# Patient Record
Sex: Female | Born: 1965 | Race: White | Hispanic: No | Marital: Married | Smoking: Never smoker
Health system: Southern US, Community
[De-identification: ages and names within clinical notes are randomized; demographics above are authoritative.]

## PROBLEM LIST (undated history)

## (undated) DIAGNOSIS — I214 Non-ST elevation (NSTEMI) myocardial infarction: Secondary | ICD-10-CM

## (undated) DIAGNOSIS — E785 Hyperlipidemia, unspecified: Secondary | ICD-10-CM

## (undated) DIAGNOSIS — F988 Other specified behavioral and emotional disorders with onset usually occurring in childhood and adolescence: Secondary | ICD-10-CM

## (undated) DIAGNOSIS — G43009 Migraine without aura, not intractable, without status migrainosus: Secondary | ICD-10-CM

## (undated) DIAGNOSIS — I2510 Atherosclerotic heart disease of native coronary artery without angina pectoris: Secondary | ICD-10-CM

## (undated) DIAGNOSIS — G4700 Insomnia, unspecified: Secondary | ICD-10-CM

## (undated) DIAGNOSIS — N189 Chronic kidney disease, unspecified: Secondary | ICD-10-CM

## (undated) DIAGNOSIS — I2542 Coronary artery dissection: Secondary | ICD-10-CM

## (undated) HISTORY — PX: LAPAROSCOPY: SHX197

## (undated) HISTORY — PX: COLONOSCOPY: SHX174

## (undated) HISTORY — DX: Other specified behavioral and emotional disorders with onset usually occurring in childhood and adolescence: F98.8

## (undated) HISTORY — DX: Atherosclerotic heart disease of native coronary artery without angina pectoris: I25.10

## (undated) HISTORY — DX: Hyperlipidemia, unspecified: E78.5

## (undated) HISTORY — DX: Insomnia, unspecified: G47.00

## (undated) HISTORY — DX: Non-ST elevation (NSTEMI) myocardial infarction: I21.4

## (undated) HISTORY — DX: Coronary artery dissection: I25.42

## (undated) HISTORY — DX: Chronic kidney disease, unspecified: N18.9

## (undated) HISTORY — DX: Migraine without aura, not intractable, without status migrainosus: G43.009

---

## 1997-07-20 ENCOUNTER — Inpatient Hospital Stay (HOSPITAL_COMMUNITY): Admission: AD | Admit: 1997-07-20 | Discharge: 1997-07-20 | Payer: Self-pay | Admitting: Obstetrics and Gynecology

## 1997-07-27 ENCOUNTER — Inpatient Hospital Stay (HOSPITAL_COMMUNITY): Admission: AD | Admit: 1997-07-27 | Discharge: 1997-07-27 | Payer: Self-pay | Admitting: Obstetrics and Gynecology

## 1997-08-23 ENCOUNTER — Other Ambulatory Visit: Admission: RE | Admit: 1997-08-23 | Discharge: 1997-08-23 | Payer: Self-pay | Admitting: Obstetrics and Gynecology

## 1997-11-05 ENCOUNTER — Inpatient Hospital Stay (HOSPITAL_COMMUNITY): Admission: AD | Admit: 1997-11-05 | Discharge: 1997-11-05 | Payer: Self-pay | Admitting: Obstetrics and Gynecology

## 1997-11-12 ENCOUNTER — Inpatient Hospital Stay (HOSPITAL_COMMUNITY): Admission: RE | Admit: 1997-11-12 | Discharge: 1997-11-12 | Payer: Self-pay | Admitting: Obstetrics and Gynecology

## 1997-11-28 ENCOUNTER — Inpatient Hospital Stay (HOSPITAL_COMMUNITY): Admission: AD | Admit: 1997-11-28 | Discharge: 1997-11-28 | Payer: Self-pay | Admitting: Obstetrics and Gynecology

## 1997-12-03 ENCOUNTER — Inpatient Hospital Stay (HOSPITAL_COMMUNITY): Admission: AD | Admit: 1997-12-03 | Discharge: 1997-12-03 | Payer: Self-pay | Admitting: Obstetrics and Gynecology

## 1997-12-26 ENCOUNTER — Inpatient Hospital Stay (HOSPITAL_COMMUNITY): Admission: AD | Admit: 1997-12-26 | Discharge: 1997-12-26 | Payer: Self-pay | Admitting: Obstetrics and Gynecology

## 1997-12-31 ENCOUNTER — Inpatient Hospital Stay (HOSPITAL_COMMUNITY): Admission: AD | Admit: 1997-12-31 | Discharge: 1997-12-31 | Payer: Self-pay | Admitting: Obstetrics and Gynecology

## 1998-01-07 ENCOUNTER — Encounter (HOSPITAL_COMMUNITY): Admission: RE | Admit: 1998-01-07 | Discharge: 1998-04-07 | Payer: Self-pay | Admitting: Obstetrics and Gynecology

## 1998-08-05 ENCOUNTER — Encounter: Admission: RE | Admit: 1998-08-05 | Discharge: 1998-11-03 | Payer: Self-pay | Admitting: Obstetrics and Gynecology

## 1998-10-20 ENCOUNTER — Inpatient Hospital Stay (HOSPITAL_COMMUNITY): Admission: AD | Admit: 1998-10-20 | Discharge: 1998-10-22 | Payer: Self-pay | Admitting: Gynecology

## 1998-10-25 ENCOUNTER — Encounter (HOSPITAL_COMMUNITY): Admission: RE | Admit: 1998-10-25 | Discharge: 1999-01-23 | Payer: Self-pay | Admitting: Gynecology

## 1998-12-06 ENCOUNTER — Other Ambulatory Visit: Admission: RE | Admit: 1998-12-06 | Discharge: 1998-12-06 | Payer: Self-pay | Admitting: Obstetrics and Gynecology

## 1999-01-25 ENCOUNTER — Encounter (HOSPITAL_COMMUNITY): Admission: RE | Admit: 1999-01-25 | Discharge: 1999-04-25 | Payer: Self-pay | Admitting: Obstetrics and Gynecology

## 1999-11-28 ENCOUNTER — Other Ambulatory Visit: Admission: RE | Admit: 1999-11-28 | Discharge: 1999-11-28 | Payer: Self-pay | Admitting: Obstetrics and Gynecology

## 2001-02-12 ENCOUNTER — Other Ambulatory Visit: Admission: RE | Admit: 2001-02-12 | Discharge: 2001-02-12 | Payer: Self-pay | Admitting: Obstetrics and Gynecology

## 2003-07-26 ENCOUNTER — Other Ambulatory Visit: Admission: RE | Admit: 2003-07-26 | Discharge: 2003-07-26 | Payer: Self-pay | Admitting: Gynecology

## 2004-09-07 ENCOUNTER — Encounter: Admission: RE | Admit: 2004-09-07 | Discharge: 2004-09-07 | Payer: Self-pay | Admitting: Gynecology

## 2004-09-21 ENCOUNTER — Other Ambulatory Visit: Admission: RE | Admit: 2004-09-21 | Discharge: 2004-09-21 | Payer: Self-pay | Admitting: Gynecology

## 2005-09-27 ENCOUNTER — Encounter: Admission: RE | Admit: 2005-09-27 | Discharge: 2005-09-27 | Payer: Self-pay | Admitting: Gynecology

## 2006-12-04 ENCOUNTER — Encounter: Admission: RE | Admit: 2006-12-04 | Discharge: 2006-12-04 | Payer: Self-pay | Admitting: Gynecology

## 2006-12-16 ENCOUNTER — Other Ambulatory Visit: Admission: RE | Admit: 2006-12-16 | Discharge: 2006-12-16 | Payer: Self-pay | Admitting: Gynecology

## 2007-07-27 ENCOUNTER — Emergency Department (HOSPITAL_COMMUNITY): Admission: EM | Admit: 2007-07-27 | Discharge: 2007-07-28 | Payer: Self-pay | Admitting: Emergency Medicine

## 2008-01-21 ENCOUNTER — Other Ambulatory Visit: Admission: RE | Admit: 2008-01-21 | Discharge: 2008-01-21 | Payer: Self-pay | Admitting: Obstetrics and Gynecology

## 2008-02-18 ENCOUNTER — Encounter: Admission: RE | Admit: 2008-02-18 | Discharge: 2008-02-18 | Payer: Self-pay | Admitting: Obstetrics and Gynecology

## 2009-03-17 ENCOUNTER — Encounter: Admission: RE | Admit: 2009-03-17 | Discharge: 2009-03-17 | Payer: Self-pay | Admitting: Obstetrics and Gynecology

## 2010-03-20 ENCOUNTER — Encounter: Admission: RE | Admit: 2010-03-20 | Discharge: 2010-03-20 | Payer: Self-pay | Admitting: Obstetrics and Gynecology

## 2010-06-05 ENCOUNTER — Encounter: Payer: Self-pay | Admitting: Obstetrics and Gynecology

## 2011-02-05 LAB — BASIC METABOLIC PANEL
CO2: 28
Chloride: 104
Creatinine, Ser: 1.08
GFR calc Af Amer: >60
Potassium: 4
Sodium: 138

## 2011-02-05 LAB — URINALYSIS, ROUTINE W REFLEX MICROSCOPIC
Bilirubin Urine: NEGATIVE
Glucose, UA: NEGATIVE
Specific Gravity, Urine: 1.03
pH: 6

## 2011-02-05 LAB — DIFFERENTIAL
Eosinophils Absolute: 0
Lymphs Abs: 1
Monocytes Absolute: 0.5
Monocytes Relative: 4
Neutrophils Relative %: 89 — ABNORMAL HIGH

## 2011-02-05 LAB — URINE MICROSCOPIC-ADD ON

## 2011-02-05 LAB — CBC
HCT: 38
Hemoglobin: 13
MCV: 87.7
RBC: 4.34
WBC: 13.8 — ABNORMAL HIGH

## 2011-04-03 ENCOUNTER — Other Ambulatory Visit: Payer: Self-pay | Admitting: Obstetrics and Gynecology

## 2011-04-03 DIAGNOSIS — Z1231 Encounter for screening mammogram for malignant neoplasm of breast: Secondary | ICD-10-CM

## 2011-04-26 ENCOUNTER — Ambulatory Visit
Admission: RE | Admit: 2011-04-26 | Discharge: 2011-04-26 | Disposition: A | Discharge status: Home / Self Care | Payer: Managed Care, Other (non HMO) | Source: Ambulatory Visit | Attending: Obstetrics and Gynecology | Admitting: Obstetrics and Gynecology

## 2011-04-26 DIAGNOSIS — Z1231 Encounter for screening mammogram for malignant neoplasm of breast: Secondary | ICD-10-CM

## 2012-04-18 ENCOUNTER — Other Ambulatory Visit: Payer: Self-pay | Admitting: Obstetrics and Gynecology

## 2012-04-18 DIAGNOSIS — Z1231 Encounter for screening mammogram for malignant neoplasm of breast: Secondary | ICD-10-CM

## 2012-05-26 ENCOUNTER — Ambulatory Visit
Admission: RE | Admit: 2012-05-26 | Discharge: 2012-05-26 | Disposition: A | Discharge status: Home / Self Care | Payer: Managed Care, Other (non HMO) | Source: Ambulatory Visit | Attending: Obstetrics and Gynecology | Admitting: Obstetrics and Gynecology

## 2012-05-26 DIAGNOSIS — Z1231 Encounter for screening mammogram for malignant neoplasm of breast: Secondary | ICD-10-CM

## 2012-05-29 ENCOUNTER — Other Ambulatory Visit: Payer: Self-pay | Admitting: Obstetrics and Gynecology

## 2012-05-29 DIAGNOSIS — R928 Other abnormal and inconclusive findings on diagnostic imaging of breast: Secondary | ICD-10-CM

## 2012-06-10 ENCOUNTER — Ambulatory Visit
Admission: RE | Admit: 2012-06-10 | Discharge: 2012-06-10 | Disposition: A | Discharge status: Home / Self Care | Payer: Managed Care, Other (non HMO) | Source: Ambulatory Visit | Attending: Obstetrics and Gynecology | Admitting: Obstetrics and Gynecology

## 2012-06-10 DIAGNOSIS — R928 Other abnormal and inconclusive findings on diagnostic imaging of breast: Secondary | ICD-10-CM

## 2013-01-27 ENCOUNTER — Telehealth: Payer: Self-pay | Admitting: *Deleted

## 2013-01-27 ENCOUNTER — Other Ambulatory Visit: Payer: Self-pay | Admitting: Obstetrics and Gynecology

## 2013-01-27 NOTE — Telephone Encounter (Signed)
LM on VM that confirms "Reached the [facility]" that request for Marcia completed.  RX Clonazepam 0.5 mg #30 (thirty),  Take one tablet by mouth at bedtime as needed for insomnia with five refills called [facility] per instructed by Dr Hyacinth Meeker.

## 2013-01-27 NOTE — Telephone Encounter (Signed)
Patient was given Rx # 30/5 RF at AEX 07/01/12. Please advise if ok to refill until next AEX.

## 2013-01-29 MED ORDER — CLONAZEPAM 0.5 MG PO TABS: ORAL_TABLET | ORAL | Status: DC

## 2013-01-29 NOTE — Addendum Note (Signed)
Addended by: Elisha Headland on: 01/29/2013 09:35 AM   Modules accepted: Orders

## 2013-06-30 ENCOUNTER — Encounter: Payer: Self-pay | Admitting: Obstetrics and Gynecology

## 2013-07-03 ENCOUNTER — Encounter: Payer: Self-pay | Admitting: Gynecology

## 2013-07-03 ENCOUNTER — Ambulatory Visit: Payer: Self-pay | Admitting: Obstetrics and Gynecology

## 2013-07-03 ENCOUNTER — Ambulatory Visit: Payer: Self-pay | Admitting: Gynecology

## 2013-07-24 ENCOUNTER — Encounter: Payer: Self-pay | Admitting: Gynecology

## 2013-07-24 ENCOUNTER — Ambulatory Visit (INDEPENDENT_AMBULATORY_CARE_PROVIDER_SITE_OTHER): Payer: Managed Care, Other (non HMO) | Admitting: Gynecology

## 2013-07-24 VITALS — BP 95/68 | HR 75 | Resp 12 | Ht 66.5 in | Wt 138.0 lb

## 2013-07-24 DIAGNOSIS — Z01419 Encounter for gynecological examination (general) (routine) without abnormal findings: Secondary | ICD-10-CM

## 2013-07-24 DIAGNOSIS — G4700 Insomnia, unspecified: Secondary | ICD-10-CM

## 2013-07-24 DIAGNOSIS — Z0000 Encounter for general adult medical examination without abnormal findings: Secondary | ICD-10-CM

## 2013-07-24 DIAGNOSIS — G43009 Migraine without aura, not intractable, without status migrainosus: Secondary | ICD-10-CM

## 2013-07-24 DIAGNOSIS — Z8632 Personal history of gestational diabetes: Secondary | ICD-10-CM

## 2013-07-24 DIAGNOSIS — Z124 Encounter for screening for malignant neoplasm of cervix: Secondary | ICD-10-CM

## 2013-07-24 LAB — POCT URINALYSIS DIPSTICK
PH UA: 5
UROBILINOGEN UA: NEGATIVE

## 2013-07-24 LAB — HEMOGLOBIN, FINGERSTICK: Hemoglobin, fingerstick: 14.1 g/dL (ref 12.0–16.0)

## 2013-07-24 MED ORDER — CLONAZEPAM 0.5 MG PO TABS: ORAL_TABLET | ORAL | Status: DC

## 2013-07-24 MED ORDER — ALMOTRIPTAN MALATE 6.25 MG PO TABS: 6.2500 mg | ORAL_TABLET | ORAL | Status: DC | PRN

## 2013-07-24 NOTE — Patient Instructions (Signed)

## 2013-07-24 NOTE — Progress Notes (Signed)
48 y.o. Married Caucasian female   [phone] [id number] here for annual exam. Pt is currently sexually active.  Pt has had a few missed cycles and then bled 34m in 12/14, variable flow light and heavy.  January-March cycles have been normal, light and 5d long, minimal cramping.  No dyspareunia or post-coital bleeding.    Patient's last menstrual period was 07/12/2013.          Sexually active: yes  The current method of family planning is Husband has Vasectomy.    Exercising: yes  Tennis, Group Exercise 3-5x/wk Last pap: 05/16/11 Neg Alcohol: less than 1  Tobacco: no BSE: yes  Mammogram: 05/29/12 Bi-Rads 1 ; scheduled   Hgb: 14.1 ; Urine: Leuks 2    Health Maintenance  Topic Date Due  . Tetanus/tdap  09/29/1982  . Influenza Vaccine  12/12/2012  . Pap Smear  05/15/2014    Family History  Problem Relation Age of Onset  . Hypertension Mother   . Ovarian cancer Mother   . Ovarian cancer Maternal Aunt   . Ovarian cancer Maternal Grandfather     There are no active problems to display for this patient.   No past medical history on file.  Past Surgical History  Procedure Laterality Date  . Laparoscopy  1995     [facility]    Allergies: Review of patient's allergies indicates no known allergies.  Current Outpatient Prescriptions  Medication Sig Dispense Refill  . clonazePAM (KLONOPIN) 0.5 MG tablet TAKE 1 TABLET BY MOUTH AT BEDTIME AS NEEDED FOR INSOMNIA  30 tablet  5  . IBUPROFEN PO Take by mouth as needed.      . Multiple Vitamins-Minerals (MULTIVITAMIN PO) Take by mouth.       No current facility-administered medications for this visit.    ROS: Pertinent items are noted in HPI.  Exam:    BP 95/68  Pulse 75  Resp 12  Ht 5' 6.5" (1.689 m)  Wt 138 lb (62.596 kg)  BMI 21.94 kg/m2  LMP 07/12/2013 Weight change: @WEIGHTCHANGE @ Last 3 height recordings:  Ht Readings from Last 3 Encounters:  07/24/13 5' 6.5" (1.689 m)   General appearance: alert, cooperative and appears stated  age Head: Normocephalic, without obvious abnormality, atraumatic Neck: no adenopathy, no carotid bruit, no JVD, supple, symmetrical, trachea midline and thyroid not enlarged, symmetric, no tenderness/mass/nodules Lungs: clear to auscultation bilaterally Breasts: normal appearance, no masses or tenderness Heart: regular rate and rhythm, S1, S2 normal, no murmur, click, rub or gallop Abdomen: soft, non-tender; bowel sounds normal; no masses,  no organomegaly Extremities: extremities normal, atraumatic, no cyanosis or edema Skin: Skin color, texture, turgor normal. No rashes or lesions Lymph nodes: Cervical, supraclavicular, and axillary nodes normal. no inguinal nodes palpated Neurologic: Grossly normal   Pelvic: External genitalia:  no lesions              Urethra: normal appearing urethra with no masses, tenderness or lesions              Bartholins and Skenes: Bartholin's, Urethra, Skene's normal                 Vagina: normal appearing vagina with normal color and discharge, no lesions              Cervix: normal appearance              Pap taken: yes        Bimanual Exam:  Uterus:  uterus is normal size, shape,  consistency and nontender                                      Adnexa:    normal adnexa in size, nontender and no masses                                      Rectovaginal: Confirms                                      Anus:  normal sphincter tone, no lesions  A: well woman Perimenopausal History of A2DM     P: mammogram pap smear with HRHPV,  New guidelines reviewed Discussed risks of DM with A2DM, suggest 2h glucose challenge q5y, pt will do next year and will do fasting labs counseled on breast self exam, mammography screening, menopause, adequate intake of calcium and vitamin D, diet and exercise return annually or prn   An After Visit Summary was printed and given to the patient.

## 2013-07-28 LAB — IPS PAP TEST WITH HPV

## 2013-08-03 ENCOUNTER — Other Ambulatory Visit: Payer: Self-pay

## 2013-08-03 DIAGNOSIS — Z1231 Encounter for screening mammogram for malignant neoplasm of breast: Secondary | ICD-10-CM

## 2013-08-24 ENCOUNTER — Ambulatory Visit
Admission: RE | Admit: 2013-08-24 | Discharge: 2013-08-24 | Disposition: A | Discharge status: Home / Self Care | Payer: Managed Care, Other (non HMO) | Source: Ambulatory Visit

## 2013-08-24 DIAGNOSIS — Z1231 Encounter for screening mammogram for malignant neoplasm of breast: Secondary | ICD-10-CM

## 2013-10-16 ENCOUNTER — Other Ambulatory Visit: Payer: Self-pay | Admitting: *Deleted

## 2013-10-16 DIAGNOSIS — G43009 Migraine without aura, not intractable, without status migrainosus: Secondary | ICD-10-CM

## 2013-10-16 NOTE — Telephone Encounter (Addendum)
[facility]ax [facility] Axert 6.25 mg Last Refilled: aex 07/24/13 #10/1 Refill  Aex Scheduled: 07/26/14  Please advise refill

## 2013-10-19 MED ORDER — ALMOTRIPTAN MALATE 6.25 MG PO TABS: 6.2500 mg | ORAL_TABLET | ORAL | Status: DC | PRN

## 2013-10-19 NOTE — Telephone Encounter (Signed)
Okay to refill Axert 6.25 mg #10/1 refill per Dr. Farrel Gobble, rx sent in to [facility].

## 2014-02-10 ENCOUNTER — Other Ambulatory Visit: Payer: Self-pay

## 2014-02-10 DIAGNOSIS — G4700 Insomnia, unspecified: Secondary | ICD-10-CM

## 2014-02-10 NOTE — Telephone Encounter (Signed)
Last AEX: 07/24/13 Last refill:07/24/13 #30 X 5 Current AEX:07/26/14  Please advise

## 2014-02-11 NOTE — Telephone Encounter (Signed)
Pt is supposed to be going out of town tomorrow. And needs the rx

## 2014-02-12 ENCOUNTER — Other Ambulatory Visit: Payer: Self-pay

## 2014-02-12 DIAGNOSIS — G43001 Migraine without aura, not intractable, with status migrainosus: Secondary | ICD-10-CM

## 2014-02-12 MED ORDER — CLONAZEPAM 0.5 MG PO TABS: ORAL_TABLET | ORAL | Status: DC

## 2014-02-12 NOTE — Telephone Encounter (Signed)
Last AEX: 07/24/13  Last refill:10/19/13 #10 X 1 Current AEX:07/26/14

## 2014-02-12 NOTE — Telephone Encounter (Signed)
Okay to refill #30/5 refills per Dr. Farrel GobbleLathrop.

## 2014-02-12 NOTE — Telephone Encounter (Signed)
RX printed, signed and faxed; patient is aware.

## 2014-02-15 NOTE — Telephone Encounter (Signed)
LMTCB

## 2014-02-15 NOTE — Telephone Encounter (Signed)
Has the patient used 20 tablets in 7647m? If she has had a change in the intensity or frequency of her headaches, she may need to see her PCP to manage, please confirm

## 2014-02-16 NOTE — Telephone Encounter (Signed)
Pt returning call

## 2014-02-16 NOTE — Telephone Encounter (Signed)
Patient says she has had more frequency of headaches and she has used 20 pills in 4 months.  She says Dr. Tresa Resomine used to refill the rx throughout the year.  Please advise, patient says she's fine either way

## 2014-02-16 NOTE — Telephone Encounter (Signed)
LMTCB

## 2014-02-19 MED ORDER — ALMOTRIPTAN MALATE 6.25 MG PO TABS: 6.2500 mg | ORAL_TABLET | ORAL | Status: DC | PRN

## 2014-02-19 NOTE — Telephone Encounter (Signed)
PT SHOULD BE SEEN BY PCP DUE TO CHANGE IN FREQUENCY OF MIGRAINES

## 2014-02-19 NOTE — Telephone Encounter (Signed)
Patient says she will see a primary care physicians and f/u with them.

## 2014-03-15 ENCOUNTER — Encounter: Payer: Self-pay | Admitting: Gynecology

## 2014-04-12 ENCOUNTER — Telehealth: Payer: Self-pay

## 2014-04-12 NOTE — Telephone Encounter (Signed)
Spoke with pt and reschedule AEX with Ms. Clayborne DanaPatti

## 2014-07-21 ENCOUNTER — Other Ambulatory Visit: Payer: Self-pay

## 2014-07-21 DIAGNOSIS — Z1231 Encounter for screening mammogram for malignant neoplasm of breast: Secondary | ICD-10-CM

## 2014-07-26 ENCOUNTER — Ambulatory Visit: Payer: Self-pay | Admitting: Nurse Practitioner

## 2014-07-26 ENCOUNTER — Ambulatory Visit: Payer: Managed Care, Other (non HMO) | Admitting: Gynecology

## 2014-08-12 ENCOUNTER — Ambulatory Visit: Payer: Self-pay | Admitting: Nurse Practitioner

## 2014-08-19 ENCOUNTER — Ambulatory Visit (INDEPENDENT_AMBULATORY_CARE_PROVIDER_SITE_OTHER): Payer: Managed Care, Other (non HMO) | Admitting: Certified Nurse Midwife

## 2014-08-19 ENCOUNTER — Telehealth: Payer: Self-pay | Admitting: Nurse Practitioner

## 2014-08-19 ENCOUNTER — Encounter: Payer: Self-pay | Admitting: Certified Nurse Midwife

## 2014-08-19 VITALS — BP 130/76 | HR 86 | Ht 66.5 in | Wt 142.8 lb

## 2014-08-19 DIAGNOSIS — N924 Excessive bleeding in the premenopausal period: Secondary | ICD-10-CM | POA: Diagnosis not present

## 2014-08-19 NOTE — Telephone Encounter (Signed)
Pt says she has had a period for 6-7 weeks.

## 2014-08-19 NOTE — Patient Instructions (Signed)
Menorrhagia °Menorrhagia is the medical term for when your menstrual periods are heavy or last longer than usual. With menorrhagia, every period you have may cause enough blood loss and cramping that you are unable to maintain your usual activities. °CAUSES  °In some cases, the cause of heavy periods is unknown, but a number of conditions may cause menorrhagia. Common causes include: °· A problem with the hormone-producing thyroid gland (hypothyroid). °· Noncancerous growths in the uterus (polyps or fibroids). °· An imbalance of the estrogen and progesterone hormones. °· One of your ovaries not releasing an egg during one or more months. °· Side effects of having an intrauterine device (IUD). °· Side effects of some medicines, such as anti-inflammatory medicines or blood thinners. °· A bleeding disorder that stops your blood from clotting normally. °SIGNS AND SYMPTOMS  °During a normal period, bleeding lasts between 4 and 8 days. Signs that your periods are too heavy include: °· You routinely have to change your pad or tampon every 1 or 2 hours because it is completely soaked. °· You pass blood clots larger than 1 inch (2.5 cm) in size. °· You have bleeding for more than 7 days. °· You need to use pads and tampons at the same time because of heavy bleeding. °· You need to wake up to change your pads or tampons during the night. °· You have symptoms of anemia, such as tiredness, fatigue, or shortness of breath.  °DIAGNOSIS  °Your health care provider will perform a physical exam and ask you questions about your symptoms and menstrual history. Other tests may be ordered based on what the health care provider finds during the exam. These tests can include: °· Blood tests. Blood tests are used to check if you are pregnant or have hormonal changes, a bleeding or thyroid disorder, low iron levels (anemia), or other problems. °· Endometrial biopsy. Your health care provider takes a sample of tissue from the inside of your  uterus to be examined under a microscope. °· Pelvic ultrasound. This test uses sound waves to make a picture of your uterus, ovaries, and vagina. The pictures can show if you have fibroids or other growths. °· Hysteroscopy. For this test, your health care provider will use a small telescope to look inside your uterus. °Based on the results of your initial tests, your health care provider may recommend further testing. °TREATMENT  °Treatment may not be needed. If it is needed, your health care provider may recommend treatment with one or more medicines first. If these do not reduce bleeding enough, a surgical treatment might be an option. The best treatment for you will depend on:  °· Whether you need to prevent pregnancy.   °· Your desire to have children in the future. °· The cause and severity of your bleeding. °· Your opinion and personal preference.   °Medicines for menorrhagia may include: °· Birth control methods that use hormones. These include the pill, skin patch, vaginal ring, shots that you get every 3 months, hormonal IUD, and implant. These treatments reduce bleeding during your menstrual period. °· Medicines that thicken blood and slow bleeding. °· Medicines that reduce swelling, such as ibuprofen.  °· Medicines that contain a synthetic hormone called progestin.   °· Medicines that make the ovaries stop working for a short time.   °You may need surgical treatment for menorrhagia if the medicines are unsuccessful. Treatment options include: °· Dilation and curettage (D&C). In this procedure, your health care provider opens (dilates) your cervix and then scrapes or suctions tissue from   the lining of your uterus to reduce menstrual bleeding. °· Operative hysteroscopy. This procedure uses a tiny tube with a light (hysteroscope) to view your uterine cavity and can help in the surgical removal of a polyp that may be causing heavy periods. °· Endometrial ablation. Through various techniques, your health care  provider permanently destroys the entire lining of your uterus (endometrium). After endometrial ablation, most women have little or no menstrual flow. Endometrial ablation reduces your ability to become pregnant. °· Endometrial resection. This surgical procedure uses an electrosurgical wire loop to remove the lining of the uterus. This procedure also reduces your ability to become pregnant. °· Hysterectomy. Surgical removal of the uterus and cervix is a permanent procedure that stops menstrual periods. Pregnancy is not possible after a hysterectomy. This procedure requires anesthesia and hospitalization. °HOME CARE INSTRUCTIONS  °· Only take over-the-counter or prescription medicines as directed by your health care provider. Take prescribed medicines exactly as directed. Do not change or switch medicines without consulting your health care provider. °· Take any prescribed iron pills exactly as directed by your health care provider. Long-term heavy bleeding may result in low iron levels. Iron pills help replace the iron your body lost from heavy bleeding. Iron may cause constipation. If this becomes a problem, increase the bran, fruits, and roughage in your diet. °· Do not take aspirin or medicines that contain aspirin 1 week before or during your menstrual period. Aspirin may make the bleeding worse. °· If you need to change your sanitary pad or tampon more than once every 2 hours, stay in bed and rest as much as possible until the bleeding stops. °· Eat well-balanced meals. Eat foods high in iron. Examples are leafy green vegetables, meat, liver, eggs, and whole grain breads and cereals. Do not try to lose weight until the abnormal bleeding has stopped and your blood iron level is back to normal. °SEEK MEDICAL CARE IF:  °· You soak through a pad or tampon every 1 or 2 hours, and this happens every time you have a period. °· You need to use pads and tampons at the same time because you are bleeding so much. °· You  need to change your pad or tampon during the night. °· You have a period that lasts for more than 8 days. °· You pass clots bigger than 1 inch wide. °· You have irregular periods that happen more or less often than once a month. °· You feel dizzy or faint. °· You feel very weak or tired. °· You feel short of breath or feel your heart is beating too fast when you exercise. °· You have nausea and vomiting or diarrhea while you are taking your medicine. °· You have any problems that may be related to the medicine you are taking. °SEEK IMMEDIATE MEDICAL CARE IF:  °· You soak through 4 or more pads or tampons in 2 hours. °· You have any bleeding while you are pregnant. °MAKE SURE YOU:  °· Understand these instructions. °· Will watch your condition. °· Will get help right away if you are not doing well or get worse. °Document Released: 04/30/2005 Document Revised: 05/05/2013 Document Reviewed: 10/19/2012 °Exit[employer]® Patient Information ©2015 [employer]. This information is not intended to replace advice given to you by your health care provider. Make sure you discuss any questions you have with your health care provider. °Perimenopause °Perimenopause is the time when your body begins to move into the menopause (no menstrual period for 12 straight months).   It is a natural process. Perimenopause can begin 2-8 years before the menopause and usually lasts for 1 year after the menopause. During this time, your ovaries may or may not produce an egg. The ovaries vary in their production of estrogen and progesterone hormones each month. This can cause irregular menstrual periods, difficulty getting pregnant, vaginal bleeding between periods, and uncomfortable symptoms. °CAUSES °· Irregular production of the ovarian hormones, estrogen and progesterone, and not ovulating every month. °· Other causes include: °¨ Tumor of the pituitary gland in the brain. °¨ Medical disease that affects the ovaries. °¨ Radiation  treatment. °¨ Chemotherapy. °¨ Unknown causes. °¨ Heavy smoking and excessive alcohol intake can bring on perimenopause sooner. °SIGNS AND SYMPTOMS  °· Hot flashes. °· Night sweats. °· Irregular menstrual periods. °· Decreased sex drive. °· Vaginal dryness. °· Headaches. °· Mood swings. °· Depression. °· Memory problems. °· Irritability. °· Tiredness. °· Weight gain. °· Trouble getting pregnant. °· The beginning of losing bone cells (osteoporosis). °· The beginning of hardening of the arteries (atherosclerosis). °DIAGNOSIS  °Your health care provider will make a diagnosis by analyzing your age, menstrual history, and symptoms. He or she will do a physical exam and note any changes in your body, especially your female organs. Female hormone tests may or may not be helpful depending on the amount of female hormones you produce and when you produce them. However, other hormone tests may be helpful to rule out other problems. °TREATMENT  °In some cases, no treatment is needed. The decision on whether treatment is necessary during the perimenopause should be made by you and your health care provider based on how the symptoms are affecting you and your lifestyle. Various treatments are available, such as: °· Treating individual symptoms with a specific medicine for that symptom. °· Herbal medicines that can help specific symptoms. °· Counseling. °· Group therapy. °HOME CARE INSTRUCTIONS  °· Keep track of your menstrual periods (when they occur, how heavy they are, how long between periods, and how long they last) as well as your symptoms and when they started. °· Only take over-the-counter or prescription medicines as directed by your health care provider. °· Sleep and rest. °· Exercise. °· Eat a diet that contains calcium (good for your bones) and soy (acts like the estrogen hormone). °· Do not smoke. °· Avoid alcoholic beverages. °· Take vitamin supplements as recommended by your health care provider. Taking vitamin E  may help in certain cases. °· Take calcium and vitamin D supplements to help prevent bone loss. °· Group therapy is sometimes helpful. °· Acupuncture may help in some cases. °SEEK MEDICAL CARE IF:  °· You have questions about any symptoms you are having. °· You need a referral to a specialist (gynecologist, psychiatrist, or psychologist). °SEEK IMMEDIATE MEDICAL CARE IF:  °· You have vaginal bleeding. °· Your period lasts longer than 8 days. °· Your periods are recurring sooner than 21 days. °· You have bleeding after intercourse. °· You have severe depression. °· You have pain when you urinate. °· You have severe headaches. °· You have vision problems. °Document Released: 06/07/2004 Document Revised: 02/18/2013 Document Reviewed: 11/27/2012 °ExitCare® Patient Information ©2015 [employer]. This information is not intended to replace advice given to you by your health care provider. Make sure you discuss any questions you have with your health care provider. ° ° °

## 2014-08-19 NOTE — Progress Notes (Signed)
48 y.o.Married white female [id number] here with complaint of bleeding with menses for the past 6 weeks. Patient had not had a period in 6 months prior to this onset on 06/28/14. Period was very heavy at onset and then proceeded to decrease after 10 days to light bleeding for several days and restarted like another period in 3/16. Had heavy bleeding for the past 3-4 days and now seems to be decreasing again like the end of period. No fatigue or weakness. Denies pelvic pain or other health issues. Patient has had some hot flashes and occasional night sweats. "Just thought I was in menopause and didn't worry about no periods". Last aex 3/15 with Dr. Farrel GobbleLathrop. Will schedule for aex today.  Slight change weight only.   O:Healthy female WDWN  POCT  Hgb.14.7 Affect: normal, orientation x 3  Exam:Skin warm and dry Thyroid normal no enlargement Abdomen:non tender, soft Lymph node: no enlargement or tenderness Pelvic exam: External genital: normal female BUS: negative Vagina: normal discharge noted scant blood present.   Cervix: normal, non tender, no CMT, no blood noted from cervix Uterus: normal, non tender Adnexa:normal, non tender, no masses or fullness noted  A:Normal pelvic exam History of prolonged amenorrhea with long cycle consistent with perimenopausal changes  P:Discussed findings of normal pelvic exam. Discussed perimenopausal changes etiology and bleeding expectations. Questions addressed. Given menses calendar to record and with normal/abnormal parameters. Discussed Advil 600 mg with food every 6 hours for next 24-48 hours to assist in decreasing bleeding. Warnings of excessive bleeding given. Discussed labs to evaluate amenorrhea, which can also be thyroid or pituitary changes also. Agreeable. Lab: TSH,FSH,Prolactin  Advise of bleeding status if continues. Schedule aex in next 2 weeks to monitor status. Patient agreeable.  Rv prn, as above

## 2014-08-19 NOTE — Telephone Encounter (Signed)
Spoke with patient. Patient states that she her last cycle was in the summer of 2015 which lasted for one month. Started this cycle 6 weeks ago. "I know I am perimenopausal but I thought this would have stopped by now. It is bright red and clotty and heavy. I am having to wear a super tampon and change it every 3-4 hours. I have never had to wear a super tampon before." Denies any fatigue, weakness, or light headedness. Advised patient will need to be seen in office for further evaluation. Patient is agreeable. Appointment scheduled for today at 2:45pm with Verner Choleborah S. Leonard CNM. Patient is agreeable to date and time.  Routing to provider for final review. Patient agreeable to disposition. Will close encounter

## 2014-08-20 LAB — FOLLICLE STIMULATING HORMONE: FSH: 38.4 m[IU]/mL

## 2014-08-20 LAB — TSH: TSH: 2.294 u[IU]/mL (ref 0.350–4.500)

## 2014-08-20 LAB — PROLACTIN: Prolactin: 3.2 ng/mL

## 2014-08-24 ENCOUNTER — Telehealth: Payer: Self-pay | Admitting: Certified Nurse Midwife

## 2014-08-24 NOTE — Telephone Encounter (Signed)
Patient calling to give Verner Choleborah S. Leonard CNM an update that her cycle has stopped. Routing to Verner Choleborah S. Leonard CNM as Lorain ChildesFYI.  Notes Recorded by Eliezer Bottomavina J Johnson, CMA on 08/20/2014 at 11:41 AM Patient notified of results as written by provider. Pt states she bleeding has slowed up & she took advil today. Pt will call us on Monday if she is still bleeding. Notes Recorded by Verner Choleborah S Leonard, CNM on 08/20/2014 at 8:19 AM Notify patient that Prolactin and TSH are normal FSH is perimenopausal range, so explains cycle changes. Advise if bleeding does not subside as discussed and follow instructions about notification if no period Patient status with Advil use on bleeding?

## 2014-08-24 NOTE — Telephone Encounter (Signed)
Patient is calling to give an update her cycle has stopped.

## 2014-08-29 NOTE — Progress Notes (Signed)
Consider PUS as well.  Reviewed personally.  Lum KeasM. Suzanne Pakou Rainbow, MD.

## 2014-08-30 ENCOUNTER — Ambulatory Visit
Admission: RE | Admit: 2014-08-30 | Discharge: 2014-08-30 | Disposition: A | Discharge status: Home / Self Care | Payer: 59 | Source: Ambulatory Visit

## 2014-08-30 DIAGNOSIS — Z1231 Encounter for screening mammogram for malignant neoplasm of breast: Secondary | ICD-10-CM

## 2014-09-06 ENCOUNTER — Ambulatory Visit (INDEPENDENT_AMBULATORY_CARE_PROVIDER_SITE_OTHER): Payer: Managed Care, Other (non HMO) | Admitting: Certified Nurse Midwife

## 2014-09-06 ENCOUNTER — Encounter: Payer: Self-pay | Admitting: Certified Nurse Midwife

## 2014-09-06 VITALS — BP 110/70 | HR 74 | Resp 16 | Ht 66.25 in | Wt 142.0 lb

## 2014-09-06 DIAGNOSIS — Z01419 Encounter for gynecological examination (general) (routine) without abnormal findings: Secondary | ICD-10-CM

## 2014-09-06 DIAGNOSIS — N951 Menopausal and female climacteric states: Secondary | ICD-10-CM

## 2014-09-06 NOTE — Progress Notes (Signed)
49 y.o. [id number] Married  Caucasian Fe here for annual exam. Periods were normal until last period on 08/01/14 which lasted until 4/10//16, which she was seen here for. FSH showed perimenopausal range. Denies any hot flashes, occasional night sweat.  Sees PCP for aex/labs, prn. Has colonoscopy scheduled. Has right wrist sprain from tennis, plans to see orthopedic. No other health issues today.  Patient's last menstrual period was 08/01/2014.          Sexually active: Yes.    The current method of family planning is vasectomy.    Exercising: Yes.    tennis,exercise class Smoker:  no  Health Maintenance: Pap: 07-24-13 neg HPV HR neg MMG: 08-30-14 category c density,birads 1:neg Colonoscopy:  Scheduled for 10-01-14  BMD:   none TDaP:  3/16 Labs: none Self breast exam: done occ   reports that she has never smoked. She has never used smokeless tobacco. She reports that she drinks alcohol. She reports that she does not use illicit drugs.  Past Medical History  Diagnosis Date  . Insomnia     klonapin  . Migraine without aura     History reviewed. No pertinent past surgical history.  Current Outpatient Prescriptions  Medication Sig Dispense Refill  . almotriptan (AXERT) 6.25 MG tablet Take 1 tablet (6.25 mg total) by mouth as needed. 10 tablet 0  . clonazePAM (KLONOPIN) 0.5 MG tablet TAKE 1 TABLET BY MOUTH AT BEDTIME AS NEEDED FOR INSOMNIA 30 tablet 5  . IBUPROFEN PO Take by mouth as needed.    . Multiple Vitamins-Minerals (MULTIVITAMIN PO) Take by mouth.     No current facility-administered medications for this visit.    Family History  Problem Relation Age of Onset  . Hypertension Mother   . Ovarian cancer Maternal Aunt     ROS:  Pertinent items are noted in HPI.  Otherwise, a comprehensive ROS was negative.  Exam:   BP 110/70 mmHg  Pulse 74  Resp 16  Ht 5' 6.25" (1.683 m)  Wt 142 lb (64.411 kg)  BMI 22.74 kg/m2  LMP 08/01/2014 Height: 5' 6.25" (168.3 cm) Ht Readings from  Last 3 Encounters:  09/06/14 5' 6.25" (1.683 m)  08/19/14 5' 6.5" (1.689 m)  07/24/13 5' 6.5" (1.689 m)    General appearance: alert, cooperative and appears stated age Head: Normocephalic, without obvious abnormality, atraumatic Neck: no adenopathy, supple, symmetrical, trachea midline and thyroid normal to inspection and palpation Lungs: clear to auscultation bilaterally Breasts: normal appearance, no masses or tenderness, No nipple retraction or dimpling, No nipple discharge or bleeding, No axillary or supraclavicular adenopathy Heart: regular rate and rhythm Abdomen: soft, non-tender; no masses,  no organomegaly Extremities: extremities normal, atraumatic, no cyanosis or edema Skin: Skin color, texture, turgor normal. No rashes or lesions Lymph nodes: Cervical, supraclavicular, and axillary nodes normal. No abnormal inguinal nodes palpated Neurologic: Grossly normal   Pelvic: External genitalia:  no lesions              Urethra:  normal appearing urethra with no masses, tenderness or lesions              Bartholin's and Skene's: normal                 Vagina: normal appearing vagina with normal color and discharge, no lesions              Cervix: normal, non tender, no lesions              Pap  taken: No. Bimanual Exam:  Uterus:  normal size, contour, position, consistency, mobility, non-tender              Adnexa: not evaluated               Rectovaginal: Confirms               Anus:  normal sphincter tone, no lesions  Chaperone present: Yes  A:  Well Woman with normal exam  Perimenopausal with cycle changes  Right wrist sprain with plans to see orthopedic  P:   Reviewed health and wellness pertinent to exam  Keeping menses calendar and will advise if no menses in 3 months.  Follow up with MD as indicated  Pap smear not taken today   counseled on breast self exam, mammography screening, adequate intake of calcium and vitamin D, diet and exercise  return annually or  prn  An After Visit Summary was printed and given to the patient.

## 2014-09-06 NOTE — Patient Instructions (Signed)

## 2014-09-07 NOTE — Progress Notes (Signed)
Reviewed personally.  M. Suzanne Declynn Lopresti, MD.  

## 2015-06-13 ENCOUNTER — Telehealth: Payer: Self-pay | Admitting: Certified Nurse Midwife

## 2015-06-13 ENCOUNTER — Encounter: Payer: Self-pay | Admitting: Obstetrics and Gynecology

## 2015-06-13 ENCOUNTER — Ambulatory Visit (INDEPENDENT_AMBULATORY_CARE_PROVIDER_SITE_OTHER): Payer: Managed Care, Other (non HMO) | Admitting: Obstetrics and Gynecology

## 2015-06-13 VITALS — BP 100/60 | HR 64 | Resp 14 | Wt 147.0 lb

## 2015-06-13 DIAGNOSIS — N939 Abnormal uterine and vaginal bleeding, unspecified: Secondary | ICD-10-CM | POA: Diagnosis not present

## 2015-06-13 DIAGNOSIS — N951 Menopausal and female climacteric states: Secondary | ICD-10-CM

## 2015-06-13 LAB — CBC
HEMATOCRIT: 40.2 % (ref 36.0–46.0)
HEMOGLOBIN: 13.9 g/dL (ref 12.0–15.0)
MCH: 29.7 pg (ref 26.0–34.0)
MCHC: 34.6 g/dL (ref 30.0–36.0)
MCV: 85.9 fL (ref 78.0–100.0)
MPV: 11 fL (ref 8.6–12.4)
Platelets: 239 10*3/uL (ref 150–400)
RBC: 4.68 MIL/uL (ref 3.87–5.11)
RDW: 14 % (ref 11.5–15.5)
WBC: 9.6 10*3/uL (ref 4.0–10.5)

## 2015-06-13 LAB — TSH: TSH: 1.118 u[IU]/mL (ref 0.350–4.500)

## 2015-06-13 LAB — FERRITIN: Ferritin: 57 ng/mL (ref 10–291)

## 2015-06-13 MED ORDER — MEDROXYPROGESTERONE ACETATE 10 MG PO TABS: ORAL_TABLET | ORAL | Status: DC

## 2015-06-13 MED ORDER — MEDROXYPROGESTERONE ACETATE 5 MG PO TABS: ORAL_TABLET | ORAL | Status: DC

## 2015-06-13 NOTE — Progress Notes (Addendum)
Patient ID: Anne Landry, female   DOB: Nov 29, 1965, 50 y.o.   MRN: [id number] GYNECOLOGY  VISIT   HPI: 50 y.o.   Married  Caucasian  female   [phone] with Patient's last menstrual period was 05/17/2015.   here c/o vaginal bleeding x 4 weeks. Very heavy bleeding last night with large blood clots. She has had intermittent spotting over the last 6 months, no real cycles. She would go months without anything. LMP started after 2 months of amenorrhea. No significant vasomotors changes. She is having some headaches, sleep issues and weight gain. She is sexually active, vasectomy for contraception.   GYNECOLOGIC HISTORY: Patient's last menstrual period was 05/17/2015. Contraception:vasectomy  Menopausal hormone therapy: None        OB History    Gravida Para Term Preterm AB TAB SAB Ectopic Multiple Living   Patient Active Problem List   Diagnosis Date Noted  . History of insulin controlled gestational diabetes mellitus 07/24/2013    Past Medical History  Diagnosis Date  . Insomnia     klonapin  . Migraine without aura     History reviewed. No pertinent past surgical history.  Current Outpatient Prescriptions  Medication Sig Dispense Refill  . almotriptan (AXERT) 6.25 MG tablet Take 1 tablet (6.25 mg total) by mouth as needed. 10 tablet 0  . IBUPROFEN PO Take by mouth as needed.    . Multiple Vitamins-Minerals (MULTIVITAMIN PO) Take by mouth.     No current facility-administered medications for this visit.     ALLERGIES: Review of patient's allergies indicates no known allergies.  Family History  Problem Relation Age of Onset  . Hypertension Mother   . Ovarian cancer Maternal Aunt     Social History   Social History  . Marital Status: Married    Spouse Name: N/A  . Number of Children: N/A  . Years of Education: N/A   Occupational History  . Not on file.   Social History Main Topics  . Smoking status: Never Smoker   . Smokeless tobacco:  Never Used  . Alcohol Use: Yes     Comment: less than 1  . Drug Use: No  . Sexual Activity:    Partners: Male     Comment: Husband has vasectomy   Other Topics Concern  . Not on file   Social History Narrative    Review of Systems  Constitutional: Negative.   HENT: Negative.   Eyes: Negative.   Respiratory: Negative.   Cardiovascular: Negative.   Gastrointestinal: Negative.   Genitourinary:       Vaginal bleeding X 4 weeks Heavy bleeding with large blood clots   Musculoskeletal: Negative.   Skin: Negative.   Neurological: Negative.   Endo/Heme/Allergies: Negative.   Psychiatric/Behavioral: Negative.     PHYSICAL EXAMINATION:    BP 100/60 mmHg  Pulse 64  Resp 14  Wt 147 lb (66.679 kg)  LMP 05/17/2015    General appearance: alert, cooperative and appears stated age Neck: no adenopathy, supple, symmetrical, trachea midline and thyroid normal to inspection and palpation  Pelvic: External genitalia:  no lesions              Urethra:  normal appearing urethra with no masses, tenderness or lesions              Bartholins and Skenes: normal  Vagina: normal appearing vagina with normal color and discharge, no lesions              Cervix: no lesions              Bimanual Exam:  Uterus:  normal size, contour, position, consistency, mobility, non-tender and anteverted              Adnexa: no mass, fullness, tenderness    The risks of endometrial biopsy were reviewed and a consent was obtained.  A speculum was placed in the vagina and the cervix was cleansed with betadine. A tenaculum was placed on the cervix and the uterine evacuator was placed into the endometrial cavity. The uterus sounded to 7-8 cm. The endometrial curetting was performed, moderate to a large amount of tissue was obtained. The tenaculum and speculum were removed. There were no complications.   Chaperone was present for exam.  ASSESSMENT Abnormal uterine bleeding, c/w an anovulatory  bleed    PLAN Office curettage done Provera 10 mg BID until bleeding stops, then 1 tab po qd x 10 days Will then treat with low dose cyclic provera CBC, ferritin, TSH   An After Visit Summary was printed and given to the patient.  25 minutes face to face time of which over 50% was spent in counseling.

## 2015-06-13 NOTE — Telephone Encounter (Signed)
Spoke with patient. She states she has been having ongoing vaginal bleeding since 05/17/15. She has had some cycles in 2016 but they were very light and did not require the use of a tampon.  She states that her cycle this month will taper off then restart again. Last night passed a large clot.  Vasectomy for birth control.  No pain. No dizziness or weakness.  Office visit today with Dr. Oscar La offered and patient accepts. Will come this afternoon for appointment for evaluation.  Routing to provider for final review. Patient agreeable to disposition. Will close encounter.

## 2015-06-13 NOTE — Telephone Encounter (Signed)
Patient is calling to talk with Leota Sauers, CNM regarding "a large clot that she passed last night". Last seen 09/06/14.

## 2015-06-13 NOTE — Patient Instructions (Signed)

## 2015-06-27 ENCOUNTER — Encounter: Payer: Self-pay | Admitting: Obstetrics and Gynecology

## 2015-06-28 ENCOUNTER — Telehealth: Payer: Self-pay | Admitting: *Deleted

## 2015-06-28 NOTE — Telephone Encounter (Signed)
Spoke with patient. The patient states that she finished taking her Provera 10 mg on 06/22/2015. During the time she was taking Provera her bleeding continued, but was light and dark brown. On February 10th she began to have heavy bleeding that was bright red in color. Experienced increased cramping and bleeding through the weekend. Reports having to wear super tampons due to soaking through tampons so frequently. She is still having light bleeding today, but states "I think it may finally stop now. I was just writing to give her an update on how I did. I am supposed to take Provera again in April." Advised patient this is a normal withdrawal bleed after taking Provera. Provera is helping to shed extra endometrial lining that has built up. It is common to have a cycle that is heavier than what she is used to after completion. Advised to continue to monitor her bleeding and if bleeding becomes heavy again she will need to contact the office. Bleeding should subside and stop. Advised she will need to take the Provera 5 mg tablets 1 tablet per day for 5 days if no menses in April. Will need to return call at that time with up date on how she did after completion of the medication. Advised I will provide Dr.Jertson and update and will return call with any further recommendations. She is agreeable.  Dr.Jertson, any additional recommendations for this patient?

## 2015-06-28 NOTE — Telephone Encounter (Signed)
See My Chart message from patient. Call to patient, left message to call back.   Hi [name],  This is Kennon Rounds, one of Dr Salli Quarry nurses. We assist the doctors with these messages to be sure that you get a timely response since Dr Oscar La is in surgery today. I will call you to discuss this further. If I miss you, please call back and ask for the triage nurse. I would like to review your symptoms. Based on your message, the pattern you described is not uncommon with the Provera. You are right, we now hope that the Provera will stop the bleeding. You may have bleeding after you finish the Provera again next month but hopefully it wont be as heavy or for as long. We can discuss further over the phone.  Thank you,  Billie Ruddy, RN Clinical Supervisor          ===View-only below this line===   ----- Message -----    From: [name]    Sent: 06/27/2015  6:12 PM EST      To: Romualdo Bolk, MD Subject: Non-Urgent Medical Question  Hi Dr. Oscar La: I wanted to let you know that I have continued to have a period even after finishing my 10 day round of Provera.  While taking the Provera, my period was light and dark brown, like it was about to end.  After I stopped taking the Provera, I had a very heavy, bright red, clotty period that started on Friday, 10th.  All weekend my flow was so heavy that i soaked thru Super tampons and pads with some cramping.  Today, things have, thankfully, lightened up and now my flow is lighter and getting darker.  I hope that means this period will finally be over.  I wanted to let you know. I plan on taking the Provera again in April as prescribed.  This is just an update to let you know what is going on.  Any advice?   Thank Anne Landry, Anne Landry [phone]

## 2015-06-28 NOTE — Telephone Encounter (Signed)
I agree

## 2015-08-04 ENCOUNTER — Other Ambulatory Visit: Payer: Self-pay

## 2015-08-04 DIAGNOSIS — Z1231 Encounter for screening mammogram for malignant neoplasm of breast: Secondary | ICD-10-CM

## 2015-08-31 ENCOUNTER — Ambulatory Visit
Admission: RE | Admit: 2015-08-31 | Discharge: 2015-08-31 | Disposition: A | Discharge status: Home / Self Care | Payer: 59 | Source: Ambulatory Visit

## 2015-08-31 DIAGNOSIS — Z1231 Encounter for screening mammogram for malignant neoplasm of breast: Secondary | ICD-10-CM

## 2015-09-08 ENCOUNTER — Ambulatory Visit: Payer: Managed Care, Other (non HMO) | Admitting: Obstetrics and Gynecology

## 2015-09-09 ENCOUNTER — Encounter: Payer: Self-pay | Admitting: Obstetrics and Gynecology

## 2015-09-09 ENCOUNTER — Ambulatory Visit: Payer: Managed Care, Other (non HMO) | Admitting: Certified Nurse Midwife

## 2015-09-09 ENCOUNTER — Ambulatory Visit (INDEPENDENT_AMBULATORY_CARE_PROVIDER_SITE_OTHER): Payer: Managed Care, Other (non HMO) | Admitting: Obstetrics and Gynecology

## 2015-09-09 VITALS — BP 110/72 | HR 66 | Resp 14 | Ht 66.5 in | Wt 144.0 lb

## 2015-09-09 DIAGNOSIS — N951 Menopausal and female climacteric states: Secondary | ICD-10-CM | POA: Diagnosis not present

## 2015-09-09 DIAGNOSIS — Z0000 Encounter for general adult medical examination without abnormal findings: Secondary | ICD-10-CM | POA: Diagnosis not present

## 2015-09-09 DIAGNOSIS — Z01419 Encounter for gynecological examination (general) (routine) without abnormal findings: Secondary | ICD-10-CM

## 2015-09-09 LAB — COMPREHENSIVE METABOLIC PANEL
ALBUMIN: 4.2 g/dL (ref 3.6–5.1)
ALK PHOS: 45 U/L (ref 33–130)
ALT: 12 U/L (ref 6–29)
AST: 19 U/L (ref 10–35)
BILIRUBIN TOTAL: 0.6 mg/dL (ref 0.2–1.2)
BUN: 16 mg/dL (ref 7–25)
CALCIUM: 9.2 mg/dL (ref 8.6–10.4)
CO2: 29 mmol/L (ref 20–31)
CREATININE: 0.82 mg/dL (ref 0.50–1.05)
Chloride: 101 mmol/L (ref 98–110)
Glucose, Bld: 83 mg/dL (ref 65–99)
Potassium: 4.3 mmol/L (ref 3.5–5.3)
SODIUM: 139 mmol/L (ref 135–146)
Total Protein: 6.8 g/dL (ref 6.1–8.1)

## 2015-09-09 NOTE — Patient Instructions (Signed)
Perimenopause Perimenopause is the time when your body begins to move into the menopause (no menstrual period for 12 straight months). It is a natural process. Perimenopause can begin 2-8 years before the menopause and usually lasts for 1 year after the menopause. During this time, your ovaries may or may not produce an egg. The ovaries vary in their production of estrogen and progesterone hormones each month. This can cause irregular menstrual periods, difficulty getting pregnant, vaginal bleeding between periods, and uncomfortable symptoms. CAUSES  Irregular production of the ovarian hormones, estrogen and progesterone, and not ovulating every month.  Other causes include:  Tumor of the pituitary gland in the brain.  Medical disease that affects the ovaries.  Radiation treatment.  Chemotherapy.  Unknown causes.  Heavy smoking and excessive alcohol intake can bring on perimenopause sooner. SIGNS AND SYMPTOMS   Hot flashes.  Night sweats.  Irregular menstrual periods.  Decreased sex drive.  Vaginal dryness.  Headaches.  Mood swings.  Depression.  Memory problems.  Irritability.  Tiredness.  Weight gain.  Trouble getting pregnant.  The beginning of losing bone cells (osteoporosis).  The beginning of hardening of the arteries (atherosclerosis). DIAGNOSIS  Your health care provider will make a diagnosis by analyzing your age, menstrual history, and symptoms. He or she will do a physical exam and note any changes in your body, especially your female organs. Female hormone tests may or may not be helpful depending on the amount of female hormones you produce and when you produce them. However, other hormone tests may be helpful to rule out other problems. TREATMENT  In some cases, no treatment is needed. The decision on whether treatment is necessary during the perimenopause should be made by you and your health care provider based on how the symptoms are affecting you  and your lifestyle. Various treatments are available, such as:  Treating individual symptoms with a specific medicine for that symptom.  Herbal medicines that can help specific symptoms.  Counseling.  Group therapy. HOME CARE INSTRUCTIONS   Keep track of your menstrual periods (when they occur, how heavy they are, how long between periods, and how long they last) as well as your symptoms and when they started.  Only take over-the-counter or prescription medicines as directed by your health care provider.  Sleep and rest.  Exercise.  Eat a diet that contains calcium (good for your bones) and soy (acts like the estrogen hormone).  Do not smoke.  Avoid alcoholic beverages.  Take vitamin supplements as recommended by your health care provider. Taking vitamin E may help in certain cases.  Take calcium and vitamin D supplements to help prevent bone loss.  Group therapy is sometimes helpful.  Acupuncture may help in some cases. SEEK MEDICAL CARE IF:   You have questions about any symptoms you are having.  You need a referral to a specialist (gynecologist, psychiatrist, or psychologist). SEEK IMMEDIATE MEDICAL CARE IF:   You have vaginal bleeding.  Your period lasts longer than 8 days.  Your periods are recurring sooner than 21 days.  You have bleeding after intercourse.  You have severe depression.  You have pain when you urinate.  You have severe headaches.  You have vision problems.   This information is not intended to replace advice given to you by your health care provider. Make sure you discuss any questions you have with your health care provider.   Document Released: 06/07/2004 Document Revised: 05/21/2014 Document Reviewed: 11/27/2012 Elsevier Interactive Patient Education [employer].  EXERCISE AND DIET:  We recommended that you start or continue a regular exercise program for good health. Regular exercise means any activity that makes your  heart beat faster and makes you sweat.  We recommend exercising at least 30 minutes per day at least 3 days a week, preferably 4 or 5.  We also recommend a diet low in fat and sugar.  Inactivity, poor dietary choices and obesity can cause diabetes, heart attack, stroke, and kidney damage, among others.    ALCOHOL AND SMOKING:  Women should limit their alcohol intake to no more than 7 drinks/beers/glasses of wine (combined, not each!) per week. Moderation of alcohol intake to this level decreases your risk of breast cancer and liver damage. And of course, no recreational drugs are part of a healthy lifestyle.  And absolutely no smoking or even second hand smoke. Most people know smoking can cause heart and lung diseases, but did you know it also contributes to weakening of your bones? Aging of your skin?  Yellowing of your teeth and nails?  CALCIUM AND VITAMIN D:  Adequate intake of calcium and Vitamin D are recommended.  The recommendations for exact amounts of these supplements seem to change often, but generally speaking 600 mg of calcium (either carbonate or citrate) and 800 units of Vitamin D per day seems prudent. Certain women may benefit from higher intake of Vitamin D.  If you are among these women, your doctor will have told you during your visit.    PAP SMEARS:  Pap smears, to check for cervical cancer or precancers,  have traditionally been done yearly, although recent scientific advances have shown that most women can have pap smears less often.  However, every woman still should have a physical exam from her gynecologist every year. It will include a breast check, inspection of the vulva and vagina to check for abnormal growths or skin changes, a visual exam of the cervix, and then an exam to evaluate the size and shape of the uterus and ovaries.  And after 52 years of age, a rectal exam is indicated to check for rectal cancers. We will also provide age appropriate advice regarding health  maintenance, like when you should have certain vaccines, screening for sexually transmitted diseases, bone density testing, colonoscopy, mammograms, etc.   MAMMOGRAMS:  All women over 52 years old should have a yearly mammogram. Many facilities now offer a "3D" mammogram, which may cost around $50 extra out of pocket. If possible,  we recommend you accept the option to have the 3D mammogram performed.  It both reduces the number of women who will be called back for extra views which then turn out to be normal, and it is better than the routine mammogram at detecting truly abnormal areas.    COLONOSCOPY:  Colonoscopy to screen for colon cancer is recommended for all women at age 52.  We know, you hate the idea of the prep.  We agree, BUT, having colon cancer and not knowing it is worse!!  Colon cancer so often starts as a polyp that can be seen and removed at colonscopy, which can quite literally save your life!  And if your first colonoscopy is normal and you have no family history of colon cancer, most women don't have to have it again for 10 years.  Once every ten years, you can do something that may end up saving your life, right?  We will be happy to help you get it scheduled when you are  ready.  Be sure to check your insurance coverage so you understand how much it will cost.  It may be covered as a preventative service at no cost, but you should check your particular policy.

## 2015-09-09 NOTE — Progress Notes (Signed)
50 y.o. [id number] MarriedCaucasianF here for annual exam.  The patient was seen a few months ago with an abnormal anovulatory bleed. Benign pathology. She was treated with a cycle of provera and was given a script of cyclic provera to take if she isn't having regular cycles. She took the provera for 5 days in early April without any bleeding. Tolerable vasomotor symptoms. No vaginal dryness or dyspareunia. She doesn't sleep well, some night sweats.  Period Duration (Days): ~ 10 days - perimenoapusal  Period Pattern: (!) Irregular Menstrual Flow: Heavy Dysmenorrhea: None  Patient's last menstrual period was 06/24/2015.          Sexually active: Yes.    The current method of family planning is vasectomy.    Exercising: Yes.    Tennis, walking Smoker:  no  Health Maintenance: Pap:  07/24/13 Neg. HR HPV:neg History of abnormal Pap:  no MMG:  08/31/15 BIRADS1:neg Colonoscopy:  09/2014 Normal - repeat 10 years - Dr. Loreta AveMann BMD:   Never TDaP:  07/2014 Gardasil: N/A   reports that she has never smoked. She has never used smokeless tobacco. She reports that she drinks alcohol. She reports that she does not use illicit drugs.She drinks less than 1 drink a week. Kids are 20 and 16. 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 Both girls. Older one is Senior next year. She works part time for an [employer].  Past Medical History  Diagnosis Date  . Insomnia     klonapin  . Migraine without aura     History reviewed. No pertinent past surgical history.  Current Outpatient Prescriptions  Medication Sig Dispense Refill  . almotriptan (AXERT) 6.25 MG tablet Take 1 tablet (6.25 mg total) by mouth as needed. 10 tablet 0  . IBUPROFEN PO Take by mouth as needed.    . tretinoin (RETIN-A) 0.025 % cream APPLY ONTO THE SKIN AT BEDTIME  2   No current facility-administered medications for this visit.    Family History  Problem Relation Age of Onset  . Hypertension Mother   . Ovarian cancer Maternal Aunt     Review  of Systems  Constitutional: Positive for unexpected weight change.  Eyes: Negative.   Respiratory: Negative.   Cardiovascular: Negative.   Gastrointestinal: Negative.   Endocrine: Negative.   Genitourinary: Positive for menstrual problem.       Unscheduled bleeding/ spotting  Musculoskeletal: Negative.   Skin: Positive for rash.  Allergic/Immunologic: Negative.   Neurological: Positive for headaches.  Hematological: Negative.   Psychiatric/Behavioral: Negative.        Difficulty memory     Exam:   BP 110/72 mmHg  Pulse 66  Resp 14  Ht 5' 6.5" (1.689 m)  Wt 144 lb (65.318 kg)  BMI 22.90 kg/m2  LMP 06/24/2015  Weight change: @WEIGHTCHANGE @ Height:   Height: 5' 6.5" (168.9 cm)  Ht Readings from Last 3 Encounters:  09/09/15 5' 6.5" (1.689 m)  09/06/14 5' 6.25" (1.683 m)  08/19/14 5' 6.5" (1.689 m)    General appearance: alert, cooperative and appears stated age Head: Normocephalic, without obvious abnormality, atraumatic Neck: no adenopathy, supple, symmetrical, trachea midline and thyroid normal to inspection and palpation Lungs: clear to auscultation bilaterally Breasts: normal appearance, no masses or tenderness Heart: regular rate and rhythm Abdomen: soft, non-tender; bowel sounds normal; no masses,  no organomegaly Extremities: extremities normal, atraumatic, no cyanosis or edema Skin: Skin color, texture, turgor normal. No rashes or lesions Lymph nodes: Cervical, supraclavicular, and axillary nodes normal. No abnormal  inguinal nodes palpated Neurologic: Grossly normal   Pelvic: External genitalia:  no lesions              Urethra:  normal appearing urethra with no masses, tenderness or lesions              Bartholins and Skenes: normal                 Vagina: normal appearing vagina with normal color and discharge, no lesions              Cervix: no lesions               Bimanual Exam:  Uterus:  normal size, contour, position, consistency, mobility, non-tender               Adnexa: no mass, fullness, tenderness               Rectovaginal: Confirms               Anus:  normal sphincter tone, no lesions  Chaperone was present for exam.  A:  Well Woman with normal exam  Perimenopause  Irregular menses  P:   No pap this year  Mammogram UTD  Recent normal CBC, normal lipids 2 years ago  Vit D, CMP  Discussed breast self exam  Discussed calcium and vit D intake

## 2015-09-10 LAB — VITAMIN D 25 HYDROXY (VIT D DEFICIENCY, FRACTURES): Vit D, 25-Hydroxy: 34 ng/mL (ref 30–100)

## 2016-09-10 ENCOUNTER — Other Ambulatory Visit: Payer: Self-pay | Admitting: Obstetrics and Gynecology

## 2016-09-10 DIAGNOSIS — Z1231 Encounter for screening mammogram for malignant neoplasm of breast: Secondary | ICD-10-CM

## 2016-09-12 ENCOUNTER — Encounter: Payer: Self-pay | Admitting: Obstetrics and Gynecology

## 2016-09-12 ENCOUNTER — Ambulatory Visit (INDEPENDENT_AMBULATORY_CARE_PROVIDER_SITE_OTHER): Payer: 59 | Admitting: Obstetrics and Gynecology

## 2016-09-12 VITALS — BP 114/70 | HR 68 | Resp 16 | Ht 66.25 in | Wt 142.0 lb

## 2016-09-12 DIAGNOSIS — Z0000 Encounter for general adult medical examination without abnormal findings: Secondary | ICD-10-CM | POA: Diagnosis not present

## 2016-09-12 DIAGNOSIS — Z01419 Encounter for gynecological examination (general) (routine) without abnormal findings: Secondary | ICD-10-CM | POA: Diagnosis not present

## 2016-09-12 DIAGNOSIS — Z124 Encounter for screening for malignant neoplasm of cervix: Secondary | ICD-10-CM

## 2016-09-12 LAB — LIPID PANEL
CHOL/HDL RATIO: 3.3 ratio (ref <5.0)
Cholesterol: 231 mg/dL — ABNORMAL HIGH (ref <200)
HDL: 69 mg/dL (ref >50)
LDL CALC: 142 mg/dL — AB (ref <100)
Triglycerides: 101 mg/dL (ref <150)
VLDL: 20 mg/dL (ref <30)

## 2016-09-12 NOTE — Progress Notes (Signed)
51 y.o. [id number] MarriedCaucasianF here for annual exam.  Postmenopausal, mild vasomotor symptoms. No vaginal bleeding. Sexually active, no pain.     Patient's last menstrual period was 06/24/2015.          Sexually active: Yes.    The current method of family planning is post menopausal status.    Exercising: Yes.    tennis, walking, yoga Smoker:  no  Health Maintenance: Pap:  07/24/13 WNL; Neg HR HPV History of abnormal Pap:  no MMG:  08/31/15 WNL Colonoscopy:  09/2014 Normal - repeat 10 years - Dr. Loreta Ave BMD:   Never  TDaP:  07/13/14 Gardasil: N/a    reports that she has never smoked. She has never used smokeless tobacco. She reports that she drinks alcohol. She reports that she does not use drugs.2 daughters, one graduating from high school, one from college She works part time for a Clinical research associate.   Past Medical History:  Diagnosis Date  . Insomnia    klonapin  . Migraine without aura     History reviewed. No pertinent surgical history.  Current Outpatient Prescriptions  Medication Sig Dispense Refill  . almotriptan (AXERT) 6.25 MG tablet Take 1 tablet (6.25 mg total) by mouth as needed. 10 tablet 0  . IBUPROFEN PO Take by mouth as needed.    . tretinoin (RETIN-A) 0.025 % cream APPLY ONTO THE SKIN AT BEDTIME  2   No current facility-administered medications for this visit.     Family History  Problem Relation Age of Onset  . Hypertension Mother   . Ovarian cancer Maternal Aunt     Review of Systems  Constitutional: Negative.   HENT: Negative.   Eyes: Negative.   Respiratory: Negative.   Cardiovascular: Negative.   Gastrointestinal: Negative.   Endocrine: Negative.   Genitourinary: Negative.   Musculoskeletal: Negative.   Skin: Negative.   Allergic/Immunologic: Negative.   Neurological:       Difficulty with memory or speech  Hematological: Negative.   Psychiatric/Behavioral: Negative.   Memory has gotten worse, she will talk with her primary MD. She spoke with a  Neurologist about her memory. She offered her testing if she wanted it.   Exam:   BP 114/70 (BP Location: Right Arm, Patient Position: Sitting, Cuff Size: Normal)   Pulse 68   Resp 16   Ht 5' 6.25" (1.683 m)   Wt 142 lb (64.4 kg)   LMP 06/24/2015   BMI 22.75 kg/m   Weight change: @ Height:   Height: 5' 6.25" (168.3 cm)  Ht Readings from Last 3 Encounters:  09/12/16 5' 6.25" (1.683 m)  09/09/15 5' 6.5" (1.689 m)  09/06/14 5' 6.25" (1.683 m)    General appearance: alert, cooperative and appears stated age Head: Normocephalic, without obvious abnormality, atraumatic Neck: no adenopathy, supple, symmetrical, trachea midline and thyroid normal to inspection and palpation Lungs: clear to auscultation bilaterally Cardiovascular: regular rate and rhythm Breasts: normal appearance, no masses or tenderness Abdomen: soft, non-tender; bowel sounds normal; no masses,  no organomegaly Extremities: extremities normal, atraumatic, no cyanosis or edema Skin: Skin color, texture, turgor normal. No rashes or lesions Lymph nodes: Cervical, supraclavicular, and axillary nodes normal. No abnormal inguinal nodes palpated Neurologic: Grossly normal   Pelvic: External genitalia:  no lesions              Urethra:  normal appearing urethra with no masses, tenderness or lesions              Bartholins and Skenes:  normal                 Vagina: normal appearing vagina with normal color and discharge, no lesions              Cervix: no lesions               Bimanual Exam:  Uterus:  normal size, contour, position, consistency, mobility, non-tender              Adnexa: no mass, fullness, tenderness               Rectovaginal: Confirms               Anus:  normal sphincter tone, no lesions  Chaperone was present for exam.  A:  Well Woman with normal exam  P:   Pap with hpv  Mammogram  Lipid panel  Discussed breast self exam  Discussed calcium and vit D intake  Colonoscopy  UTD

## 2016-09-17 LAB — IPS PAP TEST WITH HPV

## 2016-10-02 ENCOUNTER — Ambulatory Visit
Admission: RE | Admit: 2016-10-02 | Discharge: 2016-10-02 | Disposition: A | Discharge status: Home / Self Care | Payer: 59 | Source: Ambulatory Visit | Attending: Obstetrics and Gynecology | Admitting: Obstetrics and Gynecology

## 2016-10-02 DIAGNOSIS — Z1231 Encounter for screening mammogram for malignant neoplasm of breast: Secondary | ICD-10-CM

## 2017-09-12 ENCOUNTER — Other Ambulatory Visit: Payer: Self-pay | Admitting: Family Medicine

## 2017-09-12 DIAGNOSIS — Z1231 Encounter for screening mammogram for malignant neoplasm of breast: Secondary | ICD-10-CM

## 2017-09-16 ENCOUNTER — Other Ambulatory Visit: Payer: Self-pay

## 2017-09-16 ENCOUNTER — Ambulatory Visit (INDEPENDENT_AMBULATORY_CARE_PROVIDER_SITE_OTHER): Payer: 59 | Admitting: Obstetrics and Gynecology

## 2017-09-16 ENCOUNTER — Encounter: Payer: Self-pay | Admitting: Obstetrics and Gynecology

## 2017-09-16 VITALS — BP 118/60 | HR 64 | Resp 14 | Ht 66.25 in | Wt 146.0 lb

## 2017-09-16 DIAGNOSIS — Z01419 Encounter for gynecological examination (general) (routine) without abnormal findings: Secondary | ICD-10-CM

## 2017-09-16 NOTE — Patient Instructions (Signed)

## 2017-09-16 NOTE — Progress Notes (Signed)
52 y.o. [id number] MarriedCaucasianF here for annual exam. No vaginal bleeding. Sexually active, no pain.      Patient's last menstrual period was 06/24/2015.          Sexually active: Yes.    The current method of family planning is vasectomy.    Exercising: Yes.    walking, tennis, yoga Smoker:  no  Health Maintenance: Pap:  09/12/16 negative, HR HPV negative  History of abnormal Pap:  no MMG:  10/02/16 BIRADS 1 negative  Colonoscopy:  09/2014 Dr. Loreta Ave, repeat 10 years  BMD:   never TDaP:  07/13/14  Gardasil: no    reports that she has never smoked. She has never used smokeless tobacco. She reports that she drinks alcohol. She reports that she does not use drugs. Just occasional ETOH. 2 daughters, one is working, one in Automotive engineer.  Works for a Clinical research associate part time.   Past Medical History:  Diagnosis Date  . Insomnia    klonapin  . Migraine without aura     History reviewed. No pertinent surgical history.  Current Outpatient Medications  Medication Sig Dispense Refill  . almotriptan (AXERT) 6.25 MG tablet Take 1 tablet (6.25 mg total) by mouth as needed. 10 tablet 0  . IBUPROFEN PO Take by mouth as needed.    . Multiple Vitamins-Minerals (MULTIVITAMIN PO) Take by mouth.    . tretinoin (RETIN-A) 0.025 % cream APPLY ONTO THE SKIN AT BEDTIME  2   No current facility-administered medications for this visit.     Family History  Problem Relation Age of Onset  . Hypertension Mother   . Ovarian cancer Maternal Aunt   Aunt was in her 40's, stage I, she survived.   Review of Systems  Constitutional: Negative.   HENT: Negative.   Eyes: Negative.   Respiratory: Negative.   Cardiovascular: Negative.   Gastrointestinal: Negative.   Endocrine: Negative.   Genitourinary: Negative.   Musculoskeletal: Negative.   Skin: Negative.   Allergic/Immunologic: Negative.   Neurological: Negative.   Hematological: Negative.   Psychiatric/Behavioral: Negative.     Exam:   BP 118/60 (BP Location:  Right Arm, Patient Position: Sitting, Cuff Size: Normal)   Pulse 64   Resp 14   Ht 5' 6.25" (1.683 m)   Wt 146 lb (66.2 kg)   LMP 06/24/2015   BMI 23.39 kg/m   Weight change: @ Height:   Height: 5' 6.25" (168.3 cm)  Ht Readings from Last 3 Encounters:  09/16/17 5' 6.25" (1.683 m)  09/12/16 5' 6.25" (1.683 m)  09/09/15 5' 6.5" (1.689 m)    General appearance: alert, cooperative and appears stated age Head: Normocephalic, without obvious abnormality, atraumatic Neck: no adenopathy, supple, symmetrical, trachea midline and thyroid normal to inspection and palpation Lungs: clear to auscultation bilaterally Cardiovascular: regular rate and rhythm Breasts: normal appearance, no masses or tenderness Abdomen: soft, non-tender; non distended,  no masses,  no organomegaly Extremities: extremities normal, atraumatic, no cyanosis or edema Skin: Skin color, texture, turgor normal. No rashes or lesions Lymph nodes: Cervical, supraclavicular, and axillary nodes normal. No abnormal inguinal nodes palpated Neurologic: Grossly normal   Pelvic: External genitalia:  no lesions              Urethra:  normal appearing urethra with no masses, tenderness or lesions              Bartholins and Skenes: normal                 Vagina:  normal appearing vagina with normal color and discharge, no lesions              Cervix: no lesions               Bimanual Exam:  Uterus:  normal size, contour, position, consistency, mobility, non-tender              Adnexa: no mass, fullness, tenderness               Rectovaginal: Confirms               Anus:  normal sphincter tone, no lesions  Chaperone was present for exam.  A:  Well Woman with normal exam  P:   No pap this year  Mammogram this month  Colonoscopy, UTD  Discussed breast self exam  Discussed calcium and vit D intake  Labs with primary MD

## 2017-10-03 ENCOUNTER — Ambulatory Visit
Admission: RE | Admit: 2017-10-03 | Discharge: 2017-10-03 | Disposition: A | Discharge status: Home / Self Care | Payer: 59 | Source: Ambulatory Visit | Attending: Family Medicine | Admitting: Family Medicine

## 2017-10-03 DIAGNOSIS — Z1231 Encounter for screening mammogram for malignant neoplasm of breast: Secondary | ICD-10-CM

## 2018-10-07 NOTE — Progress Notes (Signed)
53 y.o. [id number] Married White or Caucasian Not Hispanic or Latino female here for annual exam.  No vaginal bleeding. Sexually active, no pain. Mild vasomotor symptoms. No bowel/bladder c/o.     Patient's last menstrual period was 06/24/2015.          Sexually active: Yes.    The current method of family planning is vasectomy.    Exercising: Yes.    walking Smoker:  no  Health Maintenance: Pap:  09/12/16 negative, HR HPV negative  History of abnormal Pap:  no MMG:  10/03/2017 Birads 1 negative Colonoscopy:  09/2014 Dr. Loreta AveMann, repeat 10 years  BMD:   never TDaP:  07/13/14  Gardasil: No   reports that she has never smoked. She has never used smokeless tobacco. She reports current alcohol use. She reports that she does not use drugs. Just occasional ETOH. 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 2 daughters, youngest is a Holiday representativejunior in Automotive engineercollege.   Past Medical History:  Diagnosis Date  . Insomnia    klonapin  . Migraine without aura     History reviewed. No pertinent surgical history.  Current Outpatient Medications  Medication Sig Dispense Refill  . IBUPROFEN PO Take by mouth as needed.    . Magnesium 100 MG TABS Take 100 mg by mouth daily.    . Multiple Vitamins-Minerals (MULTIVITAMIN PO) Take by mouth.    . Omega-3 1000 MG CAPS Take 1,000 mg by mouth daily.    Marland Kitchen. tretinoin (RETIN-A) 0.025 % cream APPLY ONTO THE SKIN AT BEDTIME  2   No current facility-administered medications for this visit.     Family History  Problem Relation Age of Onset  . Hypertension Mother   . Crohn's disease Father   . Ovarian cancer Maternal Aunt   Dad died in 1/20, elective colectomy for benign polyps, died of complication of surgery. Mom is having a hard time.   Review of Systems  Constitutional: Negative.   HENT: Negative.   Eyes: Negative.   Respiratory: Negative.   Cardiovascular: Negative.   Gastrointestinal: Negative.   Endocrine: Negative.   Genitourinary: Negative.   Musculoskeletal: Negative.    Skin: Negative.   Allergic/Immunologic: Negative.   Neurological: Negative.   Hematological: Negative.   Psychiatric/Behavioral: Negative.     Exam:   BP 114/80 (BP Location: Right Arm, Patient Position: Sitting, Cuff Size: Normal)   Pulse 72   Temp 98 F (36.7 C) (Skin)   Ht 5\' 7"  (1.702 m)   Wt 144 lb (65.3 kg)   LMP 06/24/2015   BMI 22.55 kg/m   Weight change: @WEIGHTCHANGE @ Height:   Height: 5\' 7"  (170.2 cm)  Ht Readings from Last 3 Encounters:  10/08/18 5\' 7"  (1.702 m)  09/16/17 5' 6.25" (1.683 m)  09/12/16 5' 6.25" (1.683 m)    General appearance: alert, cooperative and appears stated age Head: Normocephalic, without obvious abnormality, atraumatic Neck: no adenopathy, supple, symmetrical, trachea midline and thyroid normal to inspection and palpation Lungs: clear to auscultation bilaterally Cardiovascular: regular rate and rhythm Breasts: normal appearance, no masses or tenderness Abdomen: soft, non-tender; non distended,  no masses,  no organomegaly Extremities: extremities normal, atraumatic, no cyanosis or edema Skin: Skin color, texture, turgor normal. No rashes or lesions Lymph nodes: Cervical, supraclavicular, and axillary nodes normal. No abnormal inguinal nodes palpated Neurologic: Grossly normal   Pelvic: External genitalia:  no lesions              Urethra:  normal appearing urethra with no  masses, tenderness or lesions              Bartholins and Skenes: normal                 Vagina: mildly atrophic appearing vagina with normal color and discharge, no lesions              Cervix: no lesions               Bimanual Exam:  Uterus:  normal size, contour, position, consistency, mobility, non-tender              Adnexa: no mass, fullness, tenderness               Rectovaginal: Confirms               Anus:  normal sphincter tone, no lesions  Chaperone was present for exam.  A:  Well Woman with normal exam  P:   No pap this year  Discussed breast self  exam  Discussed calcium and vit D intake  Mammogram due  Colonoscopy UTD  Screening labs

## 2018-10-08 ENCOUNTER — Ambulatory Visit (INDEPENDENT_AMBULATORY_CARE_PROVIDER_SITE_OTHER): Payer: 59 | Admitting: Obstetrics and Gynecology

## 2018-10-08 ENCOUNTER — Encounter: Payer: Self-pay | Admitting: Obstetrics and Gynecology

## 2018-10-08 ENCOUNTER — Other Ambulatory Visit: Payer: Self-pay

## 2018-10-08 VITALS — BP 114/80 | HR 72 | Temp 98.0°F | Ht 67.0 in | Wt 144.0 lb

## 2018-10-08 DIAGNOSIS — Z01419 Encounter for gynecological examination (general) (routine) without abnormal findings: Secondary | ICD-10-CM | POA: Diagnosis not present

## 2018-10-08 DIAGNOSIS — Z0000 Encounter for general adult medical examination without abnormal findings: Secondary | ICD-10-CM | POA: Diagnosis not present

## 2018-10-08 NOTE — Patient Instructions (Signed)

## 2018-10-09 LAB — CBC
Hematocrit: 40.2 % (ref 34.0–46.6)
Hemoglobin: 13.5 g/dL (ref 11.1–15.9)
MCH: 29.7 pg (ref 26.6–33.0)
MCHC: 33.6 g/dL (ref 31.5–35.7)
MCV: 89 fL (ref 79–97)
Platelets: 236 10*3/uL (ref 150–450)
RBC: 4.54 x10E6/uL (ref 3.77–5.28)
RDW: 13.4 % (ref 11.7–15.4)
WBC: 7.7 10*3/uL (ref 3.4–10.8)

## 2018-10-09 LAB — COMPREHENSIVE METABOLIC PANEL
ALT: 13 IU/L (ref 0–32)
AST: 20 IU/L (ref 0–40)
Albumin/Globulin Ratio: 2.1 (ref 1.2–2.2)
Albumin: 4.6 g/dL (ref 3.8–4.9)
Alkaline Phosphatase: 52 IU/L (ref 39–117)
BUN/Creatinine Ratio: 22 (ref 9–23)
BUN: 20 mg/dL (ref 6–24)
Bilirubin Total: 0.3 mg/dL (ref 0.0–1.2)
CO2: 28 mmol/L (ref 20–29)
Calcium: 9.5 mg/dL (ref 8.7–10.2)
Chloride: 100 mmol/L (ref 96–106)
Creatinine, Ser: 0.92 mg/dL (ref 0.57–1.00)
GFR calc Af Amer: 81 mL/min/{1.73_m2} (ref >59)
GFR calc non Af Amer: 70 mL/min/{1.73_m2} (ref >59)
Globulin, Total: 2.2 g/dL (ref 1.5–4.5)
Glucose: 101 mg/dL — ABNORMAL HIGH (ref 65–99)
Potassium: 4.3 mmol/L (ref 3.5–5.2)
Sodium: 142 mmol/L (ref 134–144)
Total Protein: 6.8 g/dL (ref 6.0–8.5)

## 2018-10-09 LAB — LIPID PANEL
Chol/HDL Ratio: 4.1 ratio (ref 0.0–4.4)
Cholesterol, Total: 264 mg/dL — ABNORMAL HIGH (ref 100–199)
HDL: 65 mg/dL (ref >39)
LDL Calculated: 176 mg/dL — ABNORMAL HIGH (ref 0–99)
Triglycerides: 113 mg/dL (ref 0–149)
VLDL Cholesterol Cal: 23 mg/dL (ref 5–40)

## 2018-11-03 ENCOUNTER — Other Ambulatory Visit: Payer: Self-pay | Admitting: Family Medicine

## 2018-11-03 DIAGNOSIS — Z1231 Encounter for screening mammogram for malignant neoplasm of breast: Secondary | ICD-10-CM

## 2018-12-15 ENCOUNTER — Ambulatory Visit
Admission: RE | Admit: 2018-12-15 | Discharge: 2018-12-15 | Disposition: A | Discharge status: Home / Self Care | Payer: 59 | Source: Ambulatory Visit | Attending: Family Medicine | Admitting: Family Medicine

## 2018-12-15 ENCOUNTER — Other Ambulatory Visit: Payer: Self-pay

## 2018-12-15 DIAGNOSIS — Z1231 Encounter for screening mammogram for malignant neoplasm of breast: Secondary | ICD-10-CM

## 2019-10-14 ENCOUNTER — Ambulatory Visit: Payer: 59 | Admitting: Obstetrics and Gynecology

## 2019-11-11 ENCOUNTER — Ambulatory Visit (INDEPENDENT_AMBULATORY_CARE_PROVIDER_SITE_OTHER): Payer: 59 | Admitting: Obstetrics and Gynecology

## 2019-11-11 ENCOUNTER — Encounter: Payer: Self-pay | Admitting: Obstetrics and Gynecology

## 2019-11-11 ENCOUNTER — Other Ambulatory Visit: Payer: Self-pay

## 2019-11-11 VITALS — BP 122/62 | HR 74 | Temp 98.1°F | Ht 66.0 in | Wt 129.0 lb

## 2019-11-11 DIAGNOSIS — Z0000 Encounter for general adult medical examination without abnormal findings: Secondary | ICD-10-CM

## 2019-11-11 DIAGNOSIS — Z01419 Encounter for gynecological examination (general) (routine) without abnormal findings: Secondary | ICD-10-CM | POA: Diagnosis not present

## 2019-11-11 NOTE — Patient Instructions (Signed)

## 2019-11-11 NOTE — Progress Notes (Signed)
54 y.o. [id number] Married White or Caucasian Not Hispanic or Latino female here for annual exam.  No bleeding, no dyspareunia.     Dad died June 04, 2022. Mom with cognitive impairment has gotten worse quickly. She is now in memory care.  Patient's last menstrual period was 06/24/2015.          Sexually active: Yes.    The current method of family planning is vasectomy.    Exercising: Yes.    Walking and tennis Smoker:  no  Health Maintenance: Pap:  09/12/16 negative, HR HPV negative History of abnormal Pap:  no MMG:  12/16/18 density c Bi-rads 1 neg  BMD:   Never  Colonoscopy: 09/2014 Dr. Loreta Ave, repeat 10 years TDaP:  07/13/14 Gardasil: no    reports that she has never smoked. She has never used smokeless tobacco. She reports current alcohol use. She reports that she does not use drugs. 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  Past Medical History:  Diagnosis Date  . Insomnia    klonapin  . Migraine without aura     History reviewed. No pertinent surgical history.  Current Outpatient Medications  Medication Sig Dispense Refill  . IBUPROFEN PO Take by mouth as needed.    . Magnesium 100 MG TABS Take 100 mg by mouth daily.    . Multiple Vitamins-Minerals (MULTIVITAMIN PO) Take by mouth.    . Omega-3 1000 MG CAPS Take 1,000 mg by mouth daily.    Marland Kitchen tretinoin (RETIN-A) 0.025 % cream APPLY ONTO THE SKIN AT BEDTIME  2   No current facility-administered medications for this visit.    Family History  Problem Relation Age of Onset  . Hypertension Mother   . Crohn's disease Father   . Ovarian cancer Maternal Aunt     Review of Systems  All other systems reviewed and are negative.   Exam:   BP 122/62   Pulse 74   Temp 98.1 F (36.7 C)   Ht 5\' 6"  (1.676 m)   Wt 129 lb (58.5 kg)   LMP 06/24/2015   SpO2 99%   BMI 20.82 kg/m   Weight change: @WEIGHTCHANGE @ Height:   Height: 5\' 6"  (167.6 cm)  Ht Readings from Last 3 Encounters:  11/11/19 5\' 6"  (1.676 m)  10/08/18 5\' 7"   (1.702 m)  09/16/17 5' 6.25" (1.683 m)    General appearance: alert, cooperative and appears stated age Head: Normocephalic, without obvious abnormality, atraumatic Neck: no adenopathy, supple, symmetrical, trachea midline and thyroid normal to inspection and palpation Lungs: clear to auscultation bilaterally Cardiovascular: regular rate and rhythm Breasts: normal appearance, no masses or tenderness Abdomen: soft, non-tender; non distended,  no masses,  no organomegaly Extremities: extremities normal, atraumatic, no cyanosis or edema Skin: Skin color, texture, turgor normal. No rashes or lesions Lymph nodes: Cervical, supraclavicular, and axillary nodes normal. No abnormal inguinal nodes palpated Neurologic: Grossly normal   Pelvic: External genitalia:  no lesions              Urethra:  normal appearing urethra with no masses, tenderness or lesions              Bartholins and Skenes: normal                 Vagina: normal appearing vagina with normal color and discharge, no lesions              Cervix: no lesions  Bimanual Exam:  Uterus:  normal size, contour, position, consistency, mobility, non-tender              Adnexa: no mass, fullness, tenderness               Rectovaginal: Confirms               Anus:  normal sphincter tone, no lesions  [name] chaperoned for the exam.  A:  Well Woman with normal exam  P:   No pap this year  Mammogram in 8/21  Screening labs  Discussed breast self exam  Discussed calcium and vit D intake  Colonoscopy UTD

## 2019-11-12 LAB — COMPREHENSIVE METABOLIC PANEL
ALT: 15 IU/L (ref 0–32)
AST: 23 IU/L (ref 0–40)
Albumin/Globulin Ratio: 1.8 (ref 1.2–2.2)
Albumin: 4.7 g/dL (ref 3.8–4.9)
Alkaline Phosphatase: 60 IU/L (ref 48–121)
BUN/Creatinine Ratio: 19 (ref 9–23)
BUN: 15 mg/dL (ref 6–24)
Bilirubin Total: 0.3 mg/dL (ref 0.0–1.2)
CO2: 22 mmol/L (ref 20–29)
Calcium: 9.8 mg/dL (ref 8.7–10.2)
Chloride: 102 mmol/L (ref 96–106)
Creatinine, Ser: 0.81 mg/dL (ref 0.57–1.00)
GFR calc Af Amer: 94 mL/min/{1.73_m2} (ref >59)
GFR calc non Af Amer: 81 mL/min/{1.73_m2} (ref >59)
Globulin, Total: 2.6 g/dL (ref 1.5–4.5)
Glucose: 76 mg/dL (ref 65–99)
Potassium: 4 mmol/L (ref 3.5–5.2)
Sodium: 143 mmol/L (ref 134–144)
Total Protein: 7.3 g/dL (ref 6.0–8.5)

## 2019-11-12 LAB — CBC
Hematocrit: 40 % (ref 34.0–46.6)
Hemoglobin: 13.8 g/dL (ref 11.1–15.9)
MCH: 30.1 pg (ref 26.6–33.0)
MCHC: 34.5 g/dL (ref 31.5–35.7)
MCV: 87 fL (ref 79–97)
Platelets: 236 10*3/uL (ref 150–450)
RBC: 4.59 x10E6/uL (ref 3.77–5.28)
RDW: 12.8 % (ref 11.7–15.4)
WBC: 6.3 10*3/uL (ref 3.4–10.8)

## 2019-11-12 LAB — LIPID PANEL
Chol/HDL Ratio: 3.4 ratio (ref 0.0–4.4)
Cholesterol, Total: 244 mg/dL — ABNORMAL HIGH (ref 100–199)
HDL: 71 mg/dL (ref >39)
LDL Chol Calc (NIH): 141 mg/dL — ABNORMAL HIGH (ref 0–99)
Triglycerides: 184 mg/dL — ABNORMAL HIGH (ref 0–149)
VLDL Cholesterol Cal: 32 mg/dL (ref 5–40)

## 2020-02-22 ENCOUNTER — Other Ambulatory Visit: Payer: Self-pay | Admitting: Family Medicine

## 2020-02-22 DIAGNOSIS — Z1231 Encounter for screening mammogram for malignant neoplasm of breast: Secondary | ICD-10-CM

## 2020-03-16 ENCOUNTER — Ambulatory Visit
Admission: RE | Admit: 2020-03-16 | Discharge: 2020-03-16 | Disposition: A | Discharge status: Home / Self Care | Payer: 59 | Source: Ambulatory Visit

## 2020-03-16 ENCOUNTER — Other Ambulatory Visit: Payer: Self-pay

## 2020-03-16 DIAGNOSIS — Z1231 Encounter for screening mammogram for malignant neoplasm of breast: Secondary | ICD-10-CM

## 2020-04-29 ENCOUNTER — Other Ambulatory Visit: Payer: Self-pay

## 2020-04-29 ENCOUNTER — Ambulatory Visit
Admission: RE | Admit: 2020-04-29 | Discharge: 2020-04-29 | Disposition: A | Discharge status: Home / Self Care | Payer: 59 | Source: Ambulatory Visit | Attending: Family Medicine | Admitting: Family Medicine

## 2020-04-29 ENCOUNTER — Other Ambulatory Visit: Payer: Self-pay | Admitting: Family Medicine

## 2020-04-29 DIAGNOSIS — R059 Cough, unspecified: Secondary | ICD-10-CM

## 2020-04-29 DIAGNOSIS — R0781 Pleurodynia: Secondary | ICD-10-CM

## 2020-11-16 ENCOUNTER — Ambulatory Visit: Payer: 59 | Admitting: Obstetrics and Gynecology

## 2020-11-17 ENCOUNTER — Ambulatory Visit (INDEPENDENT_AMBULATORY_CARE_PROVIDER_SITE_OTHER): Payer: 59 | Admitting: Obstetrics and Gynecology

## 2020-11-17 ENCOUNTER — Encounter: Payer: Self-pay | Admitting: Obstetrics and Gynecology

## 2020-11-17 ENCOUNTER — Other Ambulatory Visit: Payer: Self-pay

## 2020-11-17 VITALS — BP 110/70 | HR 76 | Ht 66.5 in | Wt 130.0 lb

## 2020-11-17 DIAGNOSIS — Z01419 Encounter for gynecological examination (general) (routine) without abnormal findings: Secondary | ICD-10-CM

## 2020-11-17 DIAGNOSIS — Z0000 Encounter for general adult medical examination without abnormal findings: Secondary | ICD-10-CM

## 2020-11-17 DIAGNOSIS — Z8632 Personal history of gestational diabetes: Secondary | ICD-10-CM | POA: Diagnosis not present

## 2020-11-17 NOTE — Patient Instructions (Signed)

## 2020-11-17 NOTE — Progress Notes (Signed)
55 y.o. [id number] Married White or Caucasian Not Hispanic or Latino female here for annual exam.  No vaginal bleeding. No dyspareunia. No longer having vasomotor symptoms.     Patient's last menstrual period was 06/24/2015.          Sexually active: Yes.    The current method of family planning is none and post menopausal status.    Exercising: Yes.     Walking and tennis  Smoker:  no  Health Maintenance: Pap:  09/12/16 negative, HR HPV negative  History of abnormal Pap:  no MMG:  03/18/20 Density C Bi-rads 1 neg  BMD:   never  Colonoscopy: 09/2014 Dr. Loreta Ave, repeat 10 years  TDaP:  07/13/14  Gardasil: no    reports that she has never smoked. She has never used smokeless tobacco. She reports current alcohol use. She reports that she does not use drugs.Just occasional ETOH. 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  Past Medical History:  Diagnosis Date   Insomnia    klonapin   Migraine without aura     History reviewed. No pertinent surgical history.  Current Outpatient Medications  Medication Sig Dispense Refill   IBUPROFEN PO Take by mouth as needed.     Magnesium 100 MG TABS Take 100 mg by mouth daily.     Multiple Vitamins-Minerals (MULTIVITAMIN PO) Take by mouth.     Omega-3 1000 MG CAPS Take 1,000 mg by mouth daily.     tretinoin (RETIN-A) 0.025 % cream APPLY ONTO THE SKIN AT BEDTIME  2   No current facility-administered medications for this visit.    Family History  Problem Relation Age of Onset   Hypertension Mother    Crohn's disease Father    Ovarian cancer Maternal Aunt     Review of Systems  All other systems reviewed and are negative.  Exam:   BP 110/70   Pulse 76   Ht 5' 6.5" (1.689 m)   Wt 130 lb (59 kg)   LMP 06/24/2015   SpO2 100%   BMI 20.67 kg/m   Weight change: @WEIGHTCHANGE @ Height:   Height: 5' 6.5" (168.9 cm)  Ht Readings from Last 3 Encounters:  11/17/20 5' 6.5" (1.689 m)  11/11/19 5\' 6"  (1.676 m)  10/08/18 5\' 7"  (1.702 m)     General appearance: alert, cooperative and appears stated age Head: Normocephalic, without obvious abnormality, atraumatic Neck: no adenopathy, supple, symmetrical, trachea midline and thyroid normal to inspection and palpation Lungs: clear to auscultation bilaterally Cardiovascular: regular rate and rhythm Breasts: normal appearance, no masses or tenderness Abdomen: soft, non-tender; non distended,  no masses,  no organomegaly Extremities: extremities normal, atraumatic, no cyanosis or edema Skin: Skin color, texture, turgor normal. No rashes or lesions Lymph nodes: Cervical, supraclavicular, and axillary nodes normal. No abnormal inguinal nodes palpated Neurologic: Grossly normal   Pelvic: External genitalia:  no lesions              Urethra:  normal appearing urethra with no masses, tenderness or lesions              Bartholins and Skenes: normal                 Vagina: normal appearing vagina with normal color and discharge, no lesions              Cervix: no lesions               Bimanual Exam:  Uterus:  normal size, contour, position, consistency, mobility, non-tender              Adnexa: no mass, fullness, tenderness               Rectovaginal: Confirms               Anus:  normal sphincter tone, no lesions  [name] chaperoned for the exam.  1. Well woman exam Discussed breast self exam Discussed calcium and vit D intake Mammogram and colonoscopy are UTD Pap next year  2. History of insulin controlled gestational diabetes mellitus - Hemoglobin A1c  3. Laboratory exam ordered as part of routine general medical examination Fasting labs - CBC - Comprehensive metabolic panel - Lipid panel

## 2020-11-18 LAB — CBC
HCT: 40.5 % (ref 35.0–45.0)
Hemoglobin: 13.6 g/dL (ref 11.7–15.5)
MCH: 29.6 pg (ref 27.0–33.0)
MCHC: 33.6 g/dL (ref 32.0–36.0)
MCV: 88 fL (ref 80.0–100.0)
MPV: 11.8 fL (ref 7.5–12.5)
Platelets: 211 10*3/uL (ref 140–400)
RBC: 4.6 10*6/uL (ref 3.80–5.10)
RDW: 13 % (ref 11.0–15.0)
WBC: 4.6 10*3/uL (ref 3.8–10.8)

## 2020-11-18 LAB — COMPREHENSIVE METABOLIC PANEL
AG Ratio: 1.8 (calc) (ref 1.0–2.5)
ALT: 13 U/L (ref 6–29)
AST: 21 U/L (ref 10–35)
Albumin: 4.6 g/dL (ref 3.6–5.1)
Alkaline phosphatase (APISO): 48 U/L (ref 37–153)
BUN: 18 mg/dL (ref 7–25)
CO2: 30 mmol/L (ref 20–32)
Calcium: 9.8 mg/dL (ref 8.6–10.4)
Chloride: 103 mmol/L (ref 98–110)
Creat: 0.96 mg/dL (ref 0.50–1.05)
Globulin: 2.6 g/dL (calc) (ref 1.9–3.7)
Glucose, Bld: 89 mg/dL (ref 65–99)
Potassium: 4.2 mmol/L (ref 3.5–5.3)
Sodium: 141 mmol/L (ref 135–146)
Total Bilirubin: 0.7 mg/dL (ref 0.2–1.2)
Total Protein: 7.2 g/dL (ref 6.1–8.1)

## 2020-11-18 LAB — LIPID PANEL
Cholesterol: 253 mg/dL — ABNORMAL HIGH (ref <200)
HDL: 71 mg/dL (ref >=50)
LDL Cholesterol (Calc): 162 mg/dL (calc) — ABNORMAL HIGH
Non-HDL Cholesterol (Calc): 182 mg/dL (calc) — ABNORMAL HIGH (ref <130)
Total CHOL/HDL Ratio: 3.6 (calc) (ref <5.0)
Triglycerides: 95 mg/dL (ref <150)

## 2020-11-18 LAB — HEMOGLOBIN A1C
Hgb A1c MFr Bld: 5.4 % of total Hgb (ref <5.7)
Mean Plasma Glucose: 108 mg/dL
eAG (mmol/L): 6 mmol/L

## 2020-12-26 DIAGNOSIS — H833X3 Noise effects on inner ear, bilateral: Secondary | ICD-10-CM | POA: Insufficient documentation

## 2021-02-21 ENCOUNTER — Other Ambulatory Visit: Payer: Self-pay | Admitting: Family Medicine

## 2021-02-21 DIAGNOSIS — Z1231 Encounter for screening mammogram for malignant neoplasm of breast: Secondary | ICD-10-CM

## 2021-03-24 ENCOUNTER — Ambulatory Visit: Payer: 59

## 2021-04-24 ENCOUNTER — Ambulatory Visit
Admission: RE | Admit: 2021-04-24 | Discharge: 2021-04-24 | Disposition: A | Discharge status: Home / Self Care | Payer: 59 | Source: Ambulatory Visit | Attending: Family Medicine | Admitting: Family Medicine

## 2021-04-24 DIAGNOSIS — Z1231 Encounter for screening mammogram for malignant neoplasm of breast: Secondary | ICD-10-CM

## 2021-10-03 ENCOUNTER — Emergency Department (HOSPITAL_COMMUNITY): Payer: 59

## 2021-10-03 ENCOUNTER — Observation Stay (HOSPITAL_COMMUNITY)
Admission: EM | Admit: 2021-10-03 | Discharge: 2021-10-05 | Disposition: A | Discharge status: Home / Self Care | Payer: 59 | Attending: Interventional Cardiology | Admitting: Interventional Cardiology

## 2021-10-03 ENCOUNTER — Emergency Department (HOSPITAL_BASED_OUTPATIENT_CLINIC_OR_DEPARTMENT_OTHER): Payer: 59

## 2021-10-03 DIAGNOSIS — R072 Precordial pain: Secondary | ICD-10-CM

## 2021-10-03 DIAGNOSIS — R0789 Other chest pain: Principal | ICD-10-CM | POA: Insufficient documentation

## 2021-10-03 DIAGNOSIS — R778 Other specified abnormalities of plasma proteins: Secondary | ICD-10-CM

## 2021-10-03 DIAGNOSIS — R079 Chest pain, unspecified: Secondary | ICD-10-CM | POA: Diagnosis present

## 2021-10-03 DIAGNOSIS — I2542 Coronary artery dissection: Secondary | ICD-10-CM

## 2021-10-03 DIAGNOSIS — I214 Non-ST elevation (NSTEMI) myocardial infarction: Secondary | ICD-10-CM | POA: Diagnosis not present

## 2021-10-03 DIAGNOSIS — I249 Acute ischemic heart disease, unspecified: Secondary | ICD-10-CM

## 2021-10-03 DIAGNOSIS — I341 Nonrheumatic mitral (valve) prolapse: Secondary | ICD-10-CM | POA: Diagnosis not present

## 2021-10-03 LAB — BASIC METABOLIC PANEL
Anion gap: 7 (ref 5–15)
BUN: 19 mg/dL (ref 6–20)
CO2: 28 mmol/L (ref 22–32)
Calcium: 9.3 mg/dL (ref 8.9–10.3)
Chloride: 104 mmol/L (ref 98–111)
Creatinine, Ser: 0.9 mg/dL (ref 0.44–1.00)
GFR, Estimated: >60 mL/min (ref >60)
Glucose, Bld: 130 mg/dL — ABNORMAL HIGH (ref 70–99)
Potassium: 3.7 mmol/L (ref 3.5–5.1)
Sodium: 139 mmol/L (ref 135–145)

## 2021-10-03 LAB — I-STAT BETA HCG BLOOD, ED (MC, WL, AP ONLY): I-stat hCG, quantitative: <5 m[IU]/mL (ref <5)

## 2021-10-03 LAB — CBC
HCT: 40.6 % (ref 36.0–46.0)
Hemoglobin: 13.8 g/dL (ref 12.0–15.0)
MCH: 30.2 pg (ref 26.0–34.0)
MCHC: 34 g/dL (ref 30.0–36.0)
MCV: 88.8 fL (ref 80.0–100.0)
Platelets: 200 10*3/uL (ref 150–400)
RBC: 4.57 MIL/uL (ref 3.87–5.11)
RDW: 12.5 % (ref 11.5–15.5)
WBC: 5.5 10*3/uL (ref 4.0–10.5)
nRBC: 0 % (ref 0.0–0.2)

## 2021-10-03 LAB — ECHOCARDIOGRAM COMPLETE
AR max vel: 2.11 cm2
AV Area VTI: 1.68 cm2
AV Area mean vel: 1.89 cm2
AV Mean grad: 3 mmHg
AV Peak grad: 5.6 mmHg
Ao pk vel: 1.18 m/s
Area-P 1/2: 4.19 cm2
Height: 66 in
S' Lateral: 2.5 cm
Weight: 2160 oz

## 2021-10-03 LAB — TROPONIN I (HIGH SENSITIVITY)
Troponin I (High Sensitivity): 27 ng/L — ABNORMAL HIGH (ref <18)
Troponin I (High Sensitivity): 116 ng/L (ref <18)
Troponin I (High Sensitivity): 308 ng/L (ref <18)
Troponin I (High Sensitivity): 480 ng/L (ref <18)

## 2021-10-03 MED ORDER — METOPROLOL TARTRATE 50 MG PO TABS
50.0000 mg | ORAL_TABLET | Freq: Once | ORAL | Status: DC
  Filled 2021-10-03: qty 1

## 2021-10-03 MED ORDER — METOPROLOL TARTRATE 50 MG PO TABS: 50.0000 mg | ORAL_TABLET | Freq: Once | ORAL | Status: DC

## 2021-10-03 MED ORDER — ASPIRIN 81 MG PO TBEC
81.0000 mg | DELAYED_RELEASE_TABLET | Freq: Every day | ORAL | Status: DC
  Administered 2021-10-05: 81 mg via ORAL
  Filled 2021-10-03 (×2): qty 1

## 2021-10-03 MED ORDER — RIVAROXABAN 10 MG PO TABS
10.0000 mg | ORAL_TABLET | Freq: Every day | ORAL | Status: DC
  Filled 2021-10-03: qty 1

## 2021-10-03 NOTE — ED Provider Notes (Signed)
[facility] EMERGENCY DEPARTMENT Provider Note   CSN: [id number] Arrival date & time: 10/03/21  1040     History  Chief Complaint  Patient presents with   Chest Pain    Anne Landry is a 56 y.o. female.   Chest Pain Associated symptoms: no fever    HPI: A 56 year old patient presents for evaluation of chest pain. Initial onset of pain was approximately 1-3 hours ago. The patient's chest pain is described as heaviness/pressure/tightness and is not worse with exertion. The patient reports some diaphoresis. The patient's chest pain is middle- or left-sided, is not well-localized, is not sharp and does radiate to the arms/jaw/neck. The patient does not complain of nausea. The patient has no history of stroke, has no history of peripheral artery disease, has not smoked in the past 90 days, denies any history of treated diabetes, has no relevant family history of coronary artery disease (first degree relative at less than age 64), is not hypertensive, has no history of hypercholesterolemia and does not have an elevated BMI (>=30).   Home Medications Prior to Admission medications   Medication Sig Start Date End Date Taking? Authorizing Provider  IBUPROFEN PO Take by mouth as needed.    [provider]  Magnesium 100 MG TABS Take 100 mg by mouth daily. 10/12/17   [provider]  Multiple Vitamins-Minerals (MULTIVITAMIN PO) Take by mouth.    [provider]  Omega-3 1000 MG CAPS Take 1,000 mg by mouth daily. 10/12/17   [provider]  tretinoin (RETIN-A) 0.025 % cream APPLY ONTO THE SKIN AT BEDTIME 08/22/15   [provider]      Allergies    Patient has no known allergies.    Review of Systems   Review of Systems  Constitutional:  Negative for fever.  Cardiovascular:  Positive for chest pain.   Physical Exam Updated Vital Signs BP 136/69   Pulse 73   Temp 97.7 F (36.5 C) (Oral)   Resp 15   Ht 1.676 m (5\' 6" )    Wt 61.2 kg   LMP 06/24/2015   SpO2 100%   BMI 21.79 kg/m  Physical Exam  ED Results / Procedures / Treatments   Labs (all labs ordered are listed, but only abnormal results are displayed) Labs Reviewed  BASIC METABOLIC PANEL - Abnormal; Notable for the following components:      Result Value   Glucose, Bld 130 (*)    All other components within normal limits  TROPONIN I (HIGH SENSITIVITY) - Abnormal; Notable for the following components:   Troponin I (High Sensitivity) 27 (*)    All other components within normal limits  TROPONIN I (HIGH SENSITIVITY) - Abnormal; Notable for the following components:   Troponin I (High Sensitivity) 116 (*)    All other components within normal limits  CBC  I-STAT BETA HCG BLOOD, ED ([facility], [facility], [facility] ONLY)    EKG EKG Interpretation  Date/Time:  Tuesday Oct 03 2021 10:50:16 EDT Ventricular Rate:  71 PR Interval:  144 QRS Duration: 104 QT Interval:  432 QTC Calculation: 469 R Axis:   80 Text Interpretation: Normal sinus rhythm Nonspecific ST abnormality Abnormal ECG No previous ECGs available Confirmed by [id number] [phone]) on 10/03/2021 1:00:25 PM  Radiology DG Chest Portable 1 View  Result Date: 10/03/2021 CLINICAL DATA:  Chest pain EXAM: PORTABLE CHEST 1 VIEW COMPARISON:  04/29/2020 FINDINGS: Heart size and mediastinal contours are within normal limits. No pleural effusion or edema  identified. No airspace opacities. The visualized osseous structures appear intact. IMPRESSION: No acute cardiopulmonary abnormalities. Electronically Signed   By: Signa Kell M.D.   On: 10/03/2021 13:59    Procedures Procedures    Medications Ordered in ED Medications - No data to display  ED Course/ Medical Decision Making/ A&P Clinical Course as of 10/03/21 1554  Tue Oct 03, 2021  1328 Troponin I (High Sensitivity)(!) elevated [JK]  1328 Basic metabolic panel(!) nl [JK]  1328 CBC nl [JK]  1331 Troponin I (High Sensitivity)(!) Delta troponin  increased [JK]  1522 Case discussed with [facility] cardiology [JK]    Clinical Course User Index [JK] Linwood Dibbles, MD   HEAR Score: 4                       Medical Decision Making Problems Addressed: ACS (acute coronary syndrome) [facility]): acute illness or injury that poses a threat to life or bodily functions Chest pain, unspecified type: acute illness or injury  Amount and/or Complexity of Data Reviewed Labs:  Decision-making details documented in ED Course. Radiology: ordered and independent interpretation performed.  Risk Drug therapy requiring intensive monitoring for toxicity. Decision regarding hospitalization.   Presented to ED for evaluation of acute chest pain.  Symptoms concerning for the possibility of acute coronary syndrome.  Patient was given aspirin and symptoms resolved by the time she arrived in the ED.  She has remained pain-free in the ED but initial troponin was elevated.  Delta troponin increased 27-116.  I have consulted with cardiology        Final Clinical Impression(s) / ED Diagnoses Final diagnoses:  Chest pain, unspecified type  ACS (acute coronary syndrome) [facility])    Rx / DC Orders ED Discharge Orders     None         Linwood Dibbles, MD 10/03/21 1609

## 2021-10-03 NOTE — Progress Notes (Signed)
  Echocardiogram 2D Echocardiogram has been performed.  Anne Landry 10/03/2021, 4:52 PM

## 2021-10-03 NOTE — H&P (Addendum)
Cardiology H&P:   Patient ID: Anne Landry MRN: [id number]; DOB: June 27, 1965  Admit date: 10/03/2021 Date of Consult: 10/03/2021  PCP:  Cari Caraway, MD   [facility] Providers Cardiologist:  None     Patient Profile:   Anne Landry is a 56 y.o. female with no past medical history who is being seen 10/03/2021 for the evaluation of chest pain  History of Present Illness:   Ms. Anne Landry is a 56 year old female without past medical history. Patient denies any cardiac history, denies any history of HLD, HTN. Patient does not smoke cigarettes. Denies significant family history of heart disease. Plays tennis and walks for exercise.   Patient was driving to her tennis game this morning when she noticed a tightness in her chest. Located in the middle of her chest, described as fullness, almost like anxiety. Developed some numbness and tingling in her left arm. Denies any SOB, dizziness. Denies nausea, vomiting, recent illness, palpitations. Denies having any recent chest pain or SOB on exertion. Reports that she had not had anything to eat this morning before tennis, and she had a very light dinner the night before. Took a meloxicam before her chest tightness began.   Labs in the ED showed Na 139, K 3.7, creatinine 0.90, WBC 5.5, hemoglobin 13.8, platelets 200. hsTn 27>>116.  EKG showed sinus rhythm. CXR without acute abnormalities.   Past Medical History:  Diagnosis Date   Insomnia    klonapin   Migraine without aura     No past surgical history on file.   Home Medications:  Prior to Admission medications   Medication Sig Start Date End Date Taking? Authorizing Provider  IBUPROFEN PO Take by mouth as needed.    [provider]  Magnesium 100 MG TABS Take 100 mg by mouth daily. 10/12/17   [provider]  Multiple Vitamins-Minerals (MULTIVITAMIN PO) Take by mouth.    [provider]  Omega-3 1000 MG CAPS Take 1,000 mg by mouth daily. 10/12/17    [provider]  tretinoin (RETIN-A) 0.025 % cream APPLY ONTO THE SKIN AT BEDTIME 08/22/15   [provider]    Inpatient Medications: Scheduled Meds:  Continuous Infusions:  PRN Meds:   Allergies:   No Known Allergies  Social History:   Social History   Socioeconomic History   Marital status: Married    Spouse name: Not on file   Number of children: Not on file   Years of education: Not on file   Highest education level: Not on file  Occupational History   Not on file  Tobacco Use   Smoking status: Never   Smokeless tobacco: Never  Vaping Use   Vaping Use: Never used  Substance and Sexual Activity   Alcohol use: Yes    Comment: less than 1   Drug use: No   Sexual activity: Yes    Partners: Male    Comment: Husband has vasectomy  Other Topics Concern   Not on file  Social History Narrative   Not on file   Social Determinants of Health   Financial Resource Strain: Not on file  Food Insecurity: Not on file  Transportation Needs: Not on file  Physical Activity: Not on file  Stress: Not on file  Social Connections: Not on file  Intimate Partner Violence: Not on file    Family History:    Family History  Problem Relation Age of Onset   Hypertension Mother    Crohn's disease Father  Ovarian cancer Maternal Aunt      ROS:  Please see the history of present illness.   All other ROS reviewed and negative.     Physical Exam/Data:   Vitals:   10/03/21 1114 10/03/21 1220 10/03/21 1445 10/03/21 1515  BP:  135/76 138/74 137/75  Pulse:  60 67 68  Resp:  17 15 15   Temp:  97.7 F (36.5 C)    TempSrc:  Oral    SpO2:  100% 93% 100%  Weight: 61.2 kg     Height: 5\' 6"  (1.676 m)      No intake or output data in the 24 hours ending 10/03/21 1531    10/03/2021   11:14 AM 11/17/2020    9:36 AM 11/11/2019    3:57 PM  Last 3 Weights  Weight (lbs) 135 lb 130 lb 129 lb  Weight (kg) 61.236 kg 58.968 kg 58.514 kg     Body mass index is 21.79  kg/m.  General:  Well nourished, well developed, in no acute distress HEENT: normal Neck: no JVD Vascular: Radial pulses 2+ bilaterally Cardiac:  normal S1, S2; RRR; no murmur  Lungs:  clear to auscultation bilaterally, no wheezing, rhonchi or rales  Abd: soft, nontender, no hepatomegaly  Ext: no edema Musculoskeletal:  No deformities, BUE and BLE strength normal and equal Skin: warm and dry  Neuro:  CNs 2-12 intact, no focal abnormalities noted Psych:  Normal affect   EKG:  The EKG was personally reviewed and demonstrates:  Sinus rhythm Telemetry:  Telemetry was personally reviewed and demonstrates:  Sinus rhythm   Relevant CV Studies:   Laboratory Data:  High Sensitivity Troponin:   Recent Labs  Lab 10/03/21 1047 10/03/21 1315  TROPONINIHS 27* 116*     Chemistry Recent Labs  Lab 10/03/21 1047  NA 139  K 3.7  CL 104  CO2 28  GLUCOSE 130*  BUN 19  CREATININE 0.90  CALCIUM 9.3  GFRNONAA >60  ANIONGAP 7    No results for input(s): PROT, ALBUMIN, AST, ALT, ALKPHOS, BILITOT in the last 168 hours. Lipids No results for input(s): CHOL, TRIG, HDL, LABVLDL, LDLCALC, CHOLHDL in the last 168 hours.  Hematology Recent Labs  Lab 10/03/21 1047  WBC 5.5  RBC 4.57  HGB 13.8  HCT 40.6  MCV 88.8  MCH 30.2  MCHC 34.0  RDW 12.5  PLT 200   Thyroid No results for input(s): TSH, FREET4 in the last 168 hours.  BNPNo results for input(s): BNP, PROBNP in the last 168 hours.  DDimer No results for input(s): DDIMER in the last 168 hours.   Radiology/Studies:  DG Chest Portable 1 View  Result Date: 10/03/2021 CLINICAL DATA:  Chest pain EXAM: PORTABLE CHEST 1 VIEW COMPARISON:  04/29/2020 FINDINGS: Heart size and mediastinal contours are within normal limits. No pleural effusion or edema identified. No airspace opacities. The visualized osseous structures appear intact. IMPRESSION: No acute cardiopulmonary abnormalities. Electronically Signed   By: Kerby Moors M.D.   On:  10/03/2021 13:59     Assessment and Plan:   Chest Pain  Troponin Elevation  - Patient presents complaining of chest tightness that began while she was driving to tennis today. Also developed numbness and tingling down left arm. Denies SOB, nausea/vomiting, palpitations. Denies any recent chest pain or sob on exertion. Currently without symptoms   - Patient does not have risk factors for CAD (no history of HLD, HTN, DM, tobacco use, significant family history. Patient is active and BMI  21)  - hsTn 27>>116. Trend troponin  - Ordered echocardiogram - If echocardiogram shows reduced EF or wall motion abnormalities, consider doing cardiac catheterization.  - Discussed with Dr. Irish Lack-- Echocardiogram grossly normal, suspected mitral valve prolapse on his preliminary read. Formal read pending - Plan for coronary CT tomorrow AM     Risk Assessment/Risk Scores:     HEAR Score (for undifferentiated chest pain):  HEAR Score: 4{      For questions or updates, please contact [place] Please consult [email or web address] for contact info under    Signed, Margie Billet, PA-C  10/03/2021 3:31 PM  I have examined the patient and reviewed assessment and plan and discussed with patient.  Agree with above as stated.  Unclear etiology of trponin elevation.  I was present during the echocardiogram.  No RWMA.  SHe does have MV thickening with prolapse.  No obvious significant MR but will await final report.  Plan for CTA coronaries to look for CAD.  Despite mild troponin elevation, severe CAD seems less likely in this patient.  Have to consider myocarditis.   No recent viral illness or exposure to sick contact.   SCAD would be a possibility.  Currently pain free.  SCAD typically resolves on its own.  If this is the cause, hopefully we can see if on CTA and avoid the risk of worsening SCAD with invasive angiography.   Larae Grooms

## 2021-10-03 NOTE — ED Provider Triage Note (Addendum)
Emergency Medicine Provider Triage Evaluation Note  Anne Landry , a 56 y.o. female  was evaluated in triage.  Pt complains of chest pressure.  She reports she was driving to play tennis when she felt pressure across her chest that went down her left arm.  No history of ACS.  Says she got somewhat diaphoretic.  Her friends gave her 325mg  aspirin and she felt better.  Currently she feels better in her chest but has some "funny feeling" in her left arm.  Review of Systems  Positive: Chest pressure, radiating down left arm Negative: Shortness of breath or palpitations  Physical Exam  BP (!) 151/89 (BP Location: Left Arm)   Pulse 61   Temp (!) 97.5 F (36.4 C) (Oral)   Resp 18   LMP 06/24/2015   SpO2 100%  Gen:   Awake, no distress   Resp:  Normal effort  MSK:   Moves extremities without difficulty  Other:  RRR, lung sounds clear, well-appearing nondiaphoretic  Medical Decision Making  Medically screening exam initiated at 10:46 AM.  Appropriate orders placed.  Anne Landry was informed that the remainder of the evaluation will be completed by another provider, this initial triage assessment does not replace that evaluation, and the importance of remaining in the ED until their evaluation is complete.     Darliss Ridgel 10/03/21 1047   EMS with multifocal PVCs, say patient was very diaphoretic on arrival   Brittney Mucha, [place], PA-C 10/03/21 1051

## 2021-10-03 NOTE — ED Triage Notes (Signed)
Pt. Stated, I was driving to play tennis and felt heavy on my chest and then my left arm pain and felt grose and one of my players gave 4 baby ASA and I did feel better but my left arm pain is still there. Not pain feels weird not like the right arm.

## 2021-10-03 NOTE — ED Triage Notes (Signed)
EMS stated, she was on the way to play tennis and driving and started having chest pain that radiated to left arm. She was sweating. Refused Nitro stated she felt fine. ASA baby 4 given

## 2021-10-04 ENCOUNTER — Other Ambulatory Visit (HOSPITAL_COMMUNITY): Payer: Self-pay

## 2021-10-04 ENCOUNTER — Encounter (HOSPITAL_COMMUNITY): Payer: Self-pay | Admitting: Interventional Cardiology

## 2021-10-04 ENCOUNTER — Other Ambulatory Visit: Payer: Self-pay

## 2021-10-04 ENCOUNTER — Observation Stay (HOSPITAL_COMMUNITY): Payer: 59

## 2021-10-04 ENCOUNTER — Observation Stay (HOSPITAL_BASED_OUTPATIENT_CLINIC_OR_DEPARTMENT_OTHER): Payer: 59

## 2021-10-04 DIAGNOSIS — R072 Precordial pain: Secondary | ICD-10-CM | POA: Diagnosis not present

## 2021-10-04 DIAGNOSIS — R079 Chest pain, unspecified: Secondary | ICD-10-CM | POA: Diagnosis not present

## 2021-10-04 DIAGNOSIS — R778 Other specified abnormalities of plasma proteins: Secondary | ICD-10-CM | POA: Diagnosis not present

## 2021-10-04 LAB — COMPREHENSIVE METABOLIC PANEL
ALT: 19 U/L (ref 0–44)
AST: 28 U/L (ref 15–41)
Albumin: 3.6 g/dL (ref 3.5–5.0)
Alkaline Phosphatase: 44 U/L (ref 38–126)
Anion gap: 5 (ref 5–15)
BUN: 19 mg/dL (ref 6–20)
CO2: 28 mmol/L (ref 22–32)
Calcium: 9.3 mg/dL (ref 8.9–10.3)
Chloride: 104 mmol/L (ref 98–111)
Creatinine, Ser: 0.92 mg/dL (ref 0.44–1.00)
GFR, Estimated: >60 mL/min (ref >60)
Glucose, Bld: 97 mg/dL (ref 70–99)
Potassium: 3.8 mmol/L (ref 3.5–5.1)
Sodium: 137 mmol/L (ref 135–145)
Total Bilirubin: 0.7 mg/dL (ref 0.3–1.2)
Total Protein: 6.5 g/dL (ref 6.5–8.1)

## 2021-10-04 MED ORDER — IOHEXOL 350 MG/ML SOLN
100.0000 mL | Freq: Once | INTRAVENOUS | Status: AC | PRN
  Administered 2021-10-04: 100 mL via INTRAVENOUS

## 2021-10-04 MED ORDER — GADOBUTROL 1 MMOL/ML IV SOLN
6.0000 mL | Freq: Once | INTRAVENOUS | Status: AC | PRN
  Administered 2021-10-04: 6 mL via INTRAVENOUS

## 2021-10-04 MED ORDER — NITROGLYCERIN 0.4 MG SL SUBL
SUBLINGUAL_TABLET | SUBLINGUAL | Status: AC
  Administered 2021-10-04: 0.8 mg
  Filled 2021-10-04: qty 2

## 2021-10-04 MED ORDER — METOPROLOL TARTRATE 5 MG/5ML IV SOLN
INTRAVENOUS | Status: AC
Start: 2021-10-04 — End: 2021-10-04
  Administered 2021-10-04: 5 mg
  Filled 2021-10-04: qty 5

## 2021-10-04 NOTE — Progress Notes (Signed)
Progress Note  Patient Name: Anne Landry Date of Encounter: 10/04/2021  [facility] Cardiologist: None Patrice Moates-new  Subjective   No chest pain  Inpatient Medications    Scheduled Meds:  aspirin EC  81 mg Oral Daily   metoprolol tartrate  50 mg Oral Once   Continuous Infusions:  PRN Meds:    Vital Signs    Vitals:   10/03/21 1841 10/03/21 2300 10/04/21 0434 10/04/21 0848  BP: 130/71 124/70 137/78 126/70  Pulse: 68 68 (!) 56 71  Resp: Temp: 98.5 F (36.9 C) 98.5 F (36.9 C) 97.9 F (36.6 C) 97.7 F (36.5 C)  TempSrc: Oral Oral Oral Oral  SpO2: 100% 98% 99% 100%  Weight: 59.1 kg  59.8 kg   Height:  (1.702 m)       Intake/Output Summary (Last 24 hours) at 10/04/2021 1237 Last data filed at 10/03/2021 2000 Gross per 24 hour  Intake 440 ml  Output 400 ml  Net 40 ml      10/04/2021    4:34 AM 10/03/2021    6:41 PM 10/03/2021   11:14 AM  Last 3 Weights  Weight (lbs) 131 lb 14.4 oz 130 lb 4.7 oz 135 lb  Weight (kg) 59.829 kg 59.1 kg 61.236 kg      Telemetry    NSR - Personally Reviewed  ECG    NSR, nonspecific ST changes - Personally Reviewed  Physical Exam   GEN: No acute distress.   Neck: No JVD Cardiac: RRR, no murmurs, rubs, or gallops.  Respiratory: Clear to auscultation bilaterally. GI: Soft, nontender, non-distended  MS: No edema; No deformity. Neuro:  Nonfocal  Psych: Normal affect   Labs    High Sensitivity Troponin:   Recent Labs  Lab 10/03/21 1047 10/03/21 1315 10/03/21 1611 10/03/21 1808  TROPONINIHS 27* 116* 308* 480*     Chemistry Recent Labs  Lab 10/03/21 1047 10/04/21 0412  NA 139 137  K 3.7 3.8  CL 104 104  CO2 28 28  GLUCOSE 130* 97  BUN 19 19  CREATININE 0.90 0.92  CALCIUM 9.3 9.3  PROT  --  6.5  ALBUMIN  --  3.6  AST  --  28  ALT  --  19  ALKPHOS  --  44  BILITOT  --  0.7  GFRNONAA >60 >60  ANIONGAP 7 5    Lipids No results for input(s): CHOL, TRIG, HDL, LABVLDL, LDLCALC,  CHOLHDL in the last 168 hours.  Hematology Recent Labs  Lab 10/03/21 1047  WBC 5.5  RBC 4.57  HGB 13.8  HCT 40.6  MCV 88.8  MCH 30.2  MCHC 34.0  RDW 12.5  PLT 200   Thyroid No results for input(s): TSH, FREET4 in the last 168 hours.  BNPNo results for input(s): BNP, PROBNP in the last 168 hours.  DDimer No results for input(s): DDIMER in the last 168 hours.   Radiology    CT CORONARY MORPH W/CTA COR W/SCORE W/CA W/CM &/OR WO/CM  Result Date: 10/04/2021 EXAM: OVER-READ INTERPRETATION  CT CHEST The following report is a limited chest CT over-read performed by radiologist Dr. Genevive Bi of [facility] on 10/04/2021. The CTA interpretation by the cardiologist is attached. COMPARISON:  None Available. FINDINGS: Limited view of the lung parenchyma demonstrates no suspicious nodularity. Airways are normal. Limited view of the mediastinum demonstrates no adenopathy. Esophagus normal. Limited view of the upper abdomen unremarkable. Limited view of the skeleton and  chest wall is unremarkable. IMPRESSION: No significant extracardiac findings. Electronically Signed   By: Genevive Bi M.D.   On: 10/04/2021 12:20   DG Chest Portable 1 View  Result Date: 10/03/2021 CLINICAL DATA:  Chest pain EXAM: PORTABLE CHEST 1 VIEW COMPARISON:  04/29/2020 FINDINGS: Heart size and mediastinal contours are within normal limits. No pleural effusion or edema identified. No airspace opacities. The visualized osseous structures appear intact. IMPRESSION: No acute cardiopulmonary abnormalities. Electronically Signed   By: Signa Kell M.D.   On: 10/03/2021 13:59   ECHOCARDIOGRAM COMPLETE  Result Date: 10/03/2021    ECHOCARDIOGRAM REPORT   Patient Name:   Anne Landry Date of Exam: 10/03/2021 Medical Rec #:  [id number]         Height:       66.0 in Accession #:    [id number]        Weight:       135.0 lb Date of Birth:  07/11/1965         BSA:          1.692 m Patient Age:    56 years           BP:           137/126 mmHg Patient Gender: F                 HR:           76 bpm. Exam Location:  Inpatient Procedure: 2D Echo, Cardiac Doppler and Color Doppler                           STAT ECHO Reported to: Dr Riley Lam on 10/03/2021 4:50:00 PM. Indications:    Chest pain  History:        Patient has no prior history of Echocardiogram examinations.                 Risk Factors:Non-Smoker.  Sonographer:    Ross Ludwig RDCS (AE) Referring Phys: [id number] Jonita Albee IMPRESSIONS  1. Left ventricular ejection fraction, by estimation, is 55 to 60%. The left ventricle has normal function. The left ventricle has no regional wall motion abnormalities. There is mild left ventricular hypertrophy. Left ventricular diastolic parameters are consistent with Grade I diastolic dysfunction (impaired relaxation).  2. Right ventricular systolic function is normal. The right ventricular size is normal. There is normal pulmonary artery systolic pressure.  3. The mitral valve is normal in structure. No evidence of mitral valve regurgitation. No evidence of mitral stenosis.  4. The aortic valve was not well visualized. Aortic valve regurgitation is not visualized.  5. The inferior vena cava is normal in size with greater than 50% respiratory variability, suggesting right atrial pressure of 3 mmHg. Comparison(s): No prior Echocardiogram. FINDINGS  Left Ventricle: Left ventricular ejection fraction, by estimation, is 55 to 60%. The left ventricle has normal function. The left ventricle has no regional wall motion abnormalities. The left ventricular internal cavity size was normal in size. There is  mild left ventricular hypertrophy. Left ventricular diastolic parameters are consistent with Grade I diastolic dysfunction (impaired relaxation). Right Ventricle: The right ventricular size is normal. No increase in right ventricular wall thickness. Right ventricular systolic function is normal. There is normal pulmonary artery  systolic pressure. The tricuspid regurgitant velocity is 1.90 m/s, and  with an assumed right atrial pressure of 3 mmHg, the estimated right ventricular systolic pressure is 17.4 mmHg. Left Atrium:  Left atrial size was normal in size. Right Atrium: Right atrial size was normal in size. Pericardium: Trivial pericardial effusion is present. Mitral Valve: The mitral valve is normal in structure. No evidence of mitral valve regurgitation. No evidence of mitral valve stenosis. Tricuspid Valve: The tricuspid valve is normal in structure. Tricuspid valve regurgitation is trivial. No evidence of tricuspid stenosis. Aortic Valve: The aortic valve was not well visualized. Aortic valve regurgitation is not visualized. Aortic valve mean gradient measures 3.0 mmHg. Aortic valve peak gradient measures 5.6 mmHg. Aortic valve area, by VTI measures 1.68 cm. Pulmonic Valve: The pulmonic valve was not well visualized. Pulmonic valve regurgitation is not visualized. No evidence of pulmonic stenosis. Aorta: The aortic root and ascending aorta are structurally normal, with no evidence of dilitation. Venous: The inferior vena cava is normal in size with greater than 50% respiratory variability, suggesting right atrial pressure of 3 mmHg. IAS/Shunts: No atrial level shunt detected by color flow Doppler.  LEFT VENTRICLE PLAX 2D LVIDd:         4.20 cm   Diastology LVIDs:         2.50 cm   LV e' medial:    8.49 cm/s LV PW:         1.10 cm   LV E/e' medial:  10.2 LV IVS:        1.10 cm   LV e' lateral:   9.90 cm/s LVOT diam:     1.90 cm   LV E/e' lateral: 8.7 LV SV:         42 LV SV Index:   25 LVOT Area:     2.84 cm  RIGHT VENTRICLE             IVC RV Basal diam:  2.50 cm     IVC diam: 2.00 cm RV S prime:     18.10 cm/s TAPSE (M-mode): 3.9 cm LEFT ATRIUM           Index        RIGHT ATRIUM           Index LA diam:      2.30 cm 1.36 cm/m   RA Area:     10.90 cm LA Vol (A2C): 26.9 ml 15.90 ml/m  RA Volume:   21.50 ml  12.71 ml/m LA Vol  (A4C): 41.8 ml 24.70 ml/m  AORTIC VALVE AV Area (Vmax):    2.11 cm AV Area (Vmean):   1.89 cm AV Area (VTI):     1.68 cm AV Vmax:           118.00 cm/s AV Vmean:          85.700 cm/s AV VTI:            0.250 m AV Peak Grad:      5.6 mmHg AV Mean Grad:      3.0 mmHg LVOT Vmax:         87.90 cm/s LVOT Vmean:        57.000 cm/s LVOT VTI:          0.148 m LVOT/AV VTI ratio: 0.59  AORTA Ao Root diam: 2.80 cm Ao Asc diam:  2.70 cm MITRAL VALVE               TRICUSPID VALVE MV Area (PHT): 4.19 cm    TR Peak grad:   14.4 mmHg MV Decel Time: 181 msec    TR Vmax:        190.00 cm/s  MV E velocity: 86.50 cm/s MV A velocity: 98.50 cm/s  SHUNTS MV E/A ratio:  0.88        Systemic VTI:  0.15 m                            Systemic Diam: 1.90 cm Riley LamMahesh Chandrasekhar MD Electronically signed by Riley LamMahesh Chandrasekhar MD Signature Date/Time: 10/03/2021/5:09:49 PM    Final     Cardiac Studies   Normal EF by echo  Patient Profile     56 y.o. female with troponin elevation  Assessment & Plan    CTA pending.  Myocarditis, SCAD are possibilities.  CTA should help rule out high risk anatomy.  ASpirin 81 mg daily.  No anticoagulation as she is pain free and there is a possibility of SCAD.   Further plans based on CTA result.   For questions or updates, please contact [facility] Please consult [email or web address] for contact info under        Signed, Lance MussJayadeep Taneya Conkel, MD  10/04/2021, 12:37 PM

## 2021-10-04 NOTE — Plan of Care (Signed)
  Problem: Clinical Measurements: Goal: Diagnostic test results will improve Outcome: Progressing Goal: Cardiovascular complication will be avoided Outcome: Progressing   Problem: Activity: Goal: Risk for activity intolerance will decrease Outcome: Progressing   

## 2021-10-04 NOTE — TOC Progression Note (Signed)
Transition of Care [facility]) - Progression Note    Patient Details  Name: Anne Landry MRN: [id number] Date of Birth: 07-19-1965  Transition of [facility]) CM/SW Contact  Leone Haven, RN Phone Number: 10/04/2021, 11:37 AM  Clinical Narrative:    from home with spouse, chest pain, for CTA today, started on xarelto. Benefit check in process. TOC will continue to follow for dc needs.         Expected Discharge Plan and Services                                                 Social Determinants of Health (SDOH) Interventions    Readmission Risk Interventions     View : No data to display.

## 2021-10-04 NOTE — Significant Event (Signed)
Pt transported to CT ?

## 2021-10-04 NOTE — TOC Benefit Eligibility Note (Signed)
Patient Product/process development scientist completed.    The patient is currently admitted and upon discharge could be taking Xarelto 20 mg.  The current 30 day co-pay is, $0.00.   The patient is insured through [employer]    Roland Earl, CPhT Pharmacy Patient Advocate Specialist [facility] Patient Advocate Team Direct Number: [phone]  Fax: [phone]

## 2021-10-05 ENCOUNTER — Other Ambulatory Visit (HOSPITAL_COMMUNITY): Payer: Self-pay

## 2021-10-05 ENCOUNTER — Telehealth: Payer: Self-pay

## 2021-10-05 DIAGNOSIS — I214 Non-ST elevation (NSTEMI) myocardial infarction: Secondary | ICD-10-CM

## 2021-10-05 DIAGNOSIS — I2542 Coronary artery dissection: Secondary | ICD-10-CM | POA: Diagnosis not present

## 2021-10-05 DIAGNOSIS — E782 Mixed hyperlipidemia: Secondary | ICD-10-CM | POA: Diagnosis not present

## 2021-10-05 MED ORDER — ROSUVASTATIN CALCIUM 20 MG PO TABS
20.0000 mg | ORAL_TABLET | Freq: Every day | ORAL | 2 refills | Status: DC
  Filled 2021-10-05: qty 90, 90d supply, fill #0

## 2021-10-05 MED ORDER — ASPIRIN 81 MG PO TBEC
81.0000 mg | DELAYED_RELEASE_TABLET | Freq: Every day | ORAL | 12 refills | Status: AC
Start: 2021-10-06 — End: ?
  Filled 2021-10-05: qty 90, 90d supply, fill #0
  Filled 2021-10-05: qty 30, 30d supply, fill #0

## 2021-10-05 NOTE — Progress Notes (Signed)
CARDIAC REHAB PHASE I   PRE:  Rate/Rhythm: 67 SR    BP: sitting 126/76    SaO2:   MODE:  Ambulation: 400 ft   POST:  Rate/Rhythm: 90 SR    BP: sitting 142/98     SaO2:   Pt ambulated at quick pace. BP elevated but no CP and SOB. Feels well. Discussed with pt and family MI/SCAD, restrictions, diet, exercise, and CRPII. Pt receptive. Will refer to [place] CRPII. [id number]  Ethelda Chick CES, ACSM 10/05/2021 12:15 PM

## 2021-10-05 NOTE — Discharge Summary (Addendum)
Discharge Summary    Patient ID: Anne Landry MRN: [id number]; DOB: 04/26/1966  Admit date: 10/03/2021 Discharge date: 10/05/2021  PCP:  Cari Caraway, MD   [facility] Providers Cardiologist:  Larae Grooms, MD     Discharge Diagnoses    Principal Problem:   Chest pain Active Problems:   Non-ST elevation (NSTEMI) myocardial infarction [facility])   Spontaneous dissection of coronary artery    Diagnostic Studies/Procedures    Echo: 10/03/21  IMPRESSIONS     1. Left ventricular ejection fraction, by estimation, is 55 to 60%. The  left ventricle has normal function. The left ventricle has no regional  wall motion abnormalities. There is mild left ventricular hypertrophy.  Left ventricular diastolic parameters  are consistent with Grade I diastolic dysfunction (impaired relaxation).   2. Right ventricular systolic function is normal. The right ventricular  size is normal. There is normal pulmonary artery systolic pressure.   3. The mitral valve is normal in structure. No evidence of mitral valve  regurgitation. No evidence of mitral stenosis.   4. The aortic valve was not well visualized. Aortic valve regurgitation  is not visualized.   5. The inferior vena cava is normal in size with greater than 50%  respiratory variability, suggesting right atrial pressure of 3 mmHg.   Comparison(s): No prior Echocardiogram.   FINDINGS   Left Ventricle: Left ventricular ejection fraction, by estimation, is 55  to 60%. The left ventricle has normal function. The left ventricle has no  regional wall motion abnormalities. The left ventricular internal cavity  size was normal in size. There is   mild left ventricular hypertrophy. Left ventricular diastolic parameters  are consistent with Grade I diastolic dysfunction (impaired relaxation).   Right Ventricle: The right ventricular size is normal. No increase in  right ventricular wall thickness. Right ventricular systolic  function is  normal. There is normal pulmonary artery systolic pressure. The tricuspid  regurgitant velocity is 1.90 m/s, and   with an assumed right atrial pressure of 3 mmHg, the estimated right  ventricular systolic pressure is 38.2 mmHg.   Left Atrium: Left atrial size was normal in size.   Right Atrium: Right atrial size was normal in size.   Pericardium: Trivial pericardial effusion is present.   Mitral Valve: The mitral valve is normal in structure. No evidence of  mitral valve regurgitation. No evidence of mitral valve stenosis.   Tricuspid Valve: The tricuspid valve is normal in structure. Tricuspid  valve regurgitation is trivial. No evidence of tricuspid stenosis.   Aortic Valve: The aortic valve was not well visualized. Aortic valve  regurgitation is not visualized. Aortic valve mean gradient measures 3.0  mmHg. Aortic valve peak gradient measures 5.6 mmHg. Aortic valve area, by  VTI measures 1.68 cm.   Pulmonic Valve: The pulmonic valve was not well visualized. Pulmonic valve  regurgitation is not visualized. No evidence of pulmonic stenosis.   Aorta: The aortic root and ascending aorta are structurally normal, with  no evidence of dilitation.   Venous: The inferior vena cava is normal in size with greater than 50%  respiratory variability, suggesting right atrial pressure of 3 mmHg.   IAS/Shunts: No atrial level shunt detected by color flow Doppler.  _____________   History of Present Illness     Anne Landry is a 56 y.o. female with no past medical history who was seen 10/03/2021 for the evaluation of chest pain.   Anne Landry is a 56 year old female without past  medical history. Patient denied any cardiac history, denied any history of HLD, HTN. Patient does not smoke cigarettes. Denied significant family history of heart disease. Plays tennis and walks for exercise.    Patient was driving to her tennis game the morning prior to admission when she noticed a  tightness in her chest. Located in the middle of her chest, described as fullness, almost like anxiety. Developed some numbness and tingling in her left arm. Denied any SOB, dizziness. Denied nausea, vomiting, recent illness, palpitations. Denied having any recent chest pain or SOB on exertion. Reports that she had not had anything to eat this morning before tennis, and she had a very light dinner the night before. Took a meloxicam before her chest tightness began.    Labs in the ED showed Na 139, K 3.7, creatinine 0.90, WBC 5.5, hemoglobin 13.8, platelets 200. hsTn 27>>116.  EKG showed sinus rhythm. CXR without acute abnormalities.   She was admitted to cardiology for further management.  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 It was felt the septal branch may be too small to be seen on CT. -- plan to treat with ASA, and statin. No room for BB as HR has been low at times -- given instructions/restrictions for activity per MD prior to DC -- seen by CR    Hyperlipidemia: LDL over 160.   -- start Crestor 20 mg daily for the anti-inflammatory effect of the statin -- Calcium score 0 based on CTA -- needs FLP/LFTs in 8 weeks  Patient seen by Dr. Irish Lack and deemed stable for discharge home. Follow up in the office has been arranged. Medications sent to the [facility] pharmacy.    AHA/ACC Clinical Performance & Quality Measures: Aspirin prescribed? - Yes ADP Receptor Inhibitor (Plavix/Clopidogrel, Brilinta/Ticagrelor or Effient/Prasugrel) prescribed (includes medically managed patients)? No, did not receive intervention  Beta Blocker prescribed? No, bradycardic High Intensity Statin (Lipitor 40-41m or Crestor 20-48m prescribed? - Yes EF assessed during THIS hospitalization? - Yes For EF <40%, was ACEI/ARB prescribed? -  Not Applicable (EF >/= 4003%For EF <40%, Aldosterone Antagonist (Spironolactone or Eplerenone) prescribed? - No  Cardiac Rehab Phase II ordered (including medically managed patients)? Yes   The patient will be scheduled for a TOC follow up appointment in 10-14 days.  A [facility] at the office where the patient should be seen for follow up.  _____________  Discharge Vitals Blood pressure 131/81, pulse 60, temperature 97.8 F (36.6 C), temperature source Oral, resp. rate 18, height '5\' 7"'  (1.702 m), weight 59.9 kg, last menstrual period 06/24/2015, SpO2 98 %.  Filed Weights   10/03/21 1841 10/04/21 0434 10/05/21 0632  Weight: 59.1 kg 59.8 kg 59.9 kg    Labs & Radiologic Studies    CBC Recent Labs    10/03/21 1047  WBC 5.5  HGB 13.8  HCT 40.6  MCV 88.8  PLT 20491 Basic Metabolic Panel Recent Labs    10/03/21 1047 10/04/21 0412  NA 139 137  K 3.7 3.8  CL 104 104  CO2 28 28  GLUCOSE 130* 97  BUN 19 19  CREATININE 0.90 0.92  CALCIUM 9.3 9.3   Liver Function Tests Recent Labs    10/04/21 0412  AST 28  ALT 19  ALKPHOS  44  BILITOT 0.7  PROT 6.5  ALBUMIN 3.6   No results for input(s): LIPASE, AMYLASE in the last 72 hours. High Sensitivity Troponin:   Recent Labs  Lab 10/03/21 1047 10/03/21 1315 10/03/21 1611 10/03/21 1808  TROPONINIHS 27* 116* 308* 480*    BNP Invalid input(s): POCBNP D-Dimer No results for input(s): DDIMER in the last 72 hours. Hemoglobin A1C No results for input(s): HGBA1C in the last 72 hours. Fasting Lipid Panel No results for input(s): CHOL, HDL, LDLCALC, TRIG, CHOLHDL, LDLDIRECT in the last 72 hours. Thyroid Function Tests No results for input(s): TSH, T4TOTAL, T3FREE, THYROIDAB in the last 72 hours.  Invalid input(s): FREET3 _____________  CT CORONARY MORPH W/CTA COR W/SCORE W/CA W/CM &/OR WO/CM  Addendum Date: 10/04/2021   ADDENDUM REPORT: 10/04/2021 13:44 CLINICAL DATA:  56  Year old White Female EXAM: Cardiac/Coronary  CTA TECHNIQUE: The patient was scanned on a [employer]. FINDINGS: Scan was triggered in the descending thoracic aorta. Axial non-contrast 3 mm slices were carried out through the heart. The data set was analyzed on a dedicated work station and scored using the [place]. Gantry rotation speed was 250 msecs and collimation was .6 mm. 0.8 mg of sl NTG was given. The 3D data set was reconstructed in 5% intervals of the 67-82 % of the R-R cycle. Diastolic phases were analyzed on a dedicated work station using MPR, MIP and VRT modes. The patient received 100 cc of contrast. Coronary Arteries:  Normal coronary origin.  Right dominance. Coronary Calcium Score: Left main: 0 Left anterior descending artery: 0 Left circumflex artery: 0 Right coronary artery: 0 Total: 0 Percentile: 1st for age, sex, and race matched control. RCA is a large dominant artery that gives rise to PDA and PLA. Stair step artifact proximal and mid RCA. Left main is a large artery that gives rise to LAD and LCX arteries. There is no significant plaque. LAD is a large vessel that gives rise to one large D1 Branch. There is no significant plaque. Stair step artifact mid LAD. LCX is a non-dominant artery that gives rise to two OM vessels branch. There is no significant plaque. Stair step artifact mid LCX. Small myocardial bridge OM1 bifurcation. Other findings: Aorta: Normal size.  No calcifications.  No dissection. Main Pulmonary Artery: Normal size of the pulmonary artery. Aortic Valve:  Tri-leaflet.  No calcifications. Normal pulmonary vein drainage into the left atrium. Normal left atrial appendage without a thrombus. Interatrial septum with no communications. Extra-cardiac findings: See attached radiology report for non-cardiac structures. Artifact: Cardiac motion slab artifact. IMPRESSION: 1. Coronary calcium score of 0. This was 1st percentile for age, sex, and race matched control. 2.  Normal coronary origin with right dominance. 3. CAD-RADS 0. No evidence of CAD (0%). Consider non-atherosclerotic causes of chest pain. RECOMMENDATIONS: Coronary artery calcium (CAC) score is a strong predictor of incident coronary heart disease (CHD) and provides predictive information beyond traditional risk factors. CAC scoring is reasonable to use in the decision to withhold, postpone, or initiate statin therapy in intermediate-risk or selected borderline-risk asymptomatic adults (age 68-75 years and LDL-C >=70 to <190 mg/dL) who do not have diabetes or established atherosclerotic cardiovascular disease (ASCVD).* In intermediate-risk (10-year ASCVD risk >=7.5% to <20%) adults or selected borderline-risk (10-year ASCVD risk >=5% to <7.5%) adults in whom a CAC score is measured for the purpose of making a treatment decision the following recommendations have been made: If CAC = 0, it is reasonable to withhold statin therapy  and reassess in 5 to 10 years, as long as higher risk conditions are absent (diabetes mellitus, family history of premature CHD in first degree relatives (males <55 years; females <65 years), cigarette smoking, LDL >=190 mg/dL or other independent risk factors). If CAC is 1 to 99, it is reasonable to initiate statin therapy for patients >=83 years of age. If CAC is >=100 or >=75th percentile, it is reasonable to initiate statin therapy at any age. Cardiology referral should be considered for patients with CAC scores =400 or >=75th percentile. *2018 AHA/ACC/AACVPR/AAPA/ABC/ACPM/ADA/AGS/APhA/ASPC/NLA/PCNA Guideline on the Management of Blood Cholesterol: A Report of the American College of Cardiology/American Heart Association Task Force on Clinical Practice Guidelines. J Am Coll Cardiol. 2019;73(24):3168-3209. Rudean Haskell, MD Electronically Signed   By: Rudean Haskell M.D.   On: 10/04/2021 13:44   Result Date: 10/04/2021 EXAM: OVER-READ INTERPRETATION  CT CHEST The following  report is a limited chest CT over-read performed by radiologist Dr. Suzy Bouchard of [facility], [place] on 10/04/2021. The CTA interpretation by the cardiologist is attached. COMPARISON:  None Available. FINDINGS: Limited view of the lung parenchyma demonstrates no suspicious nodularity. Airways are normal. Limited view of the mediastinum demonstrates no adenopathy. Esophagus normal. Limited view of the upper abdomen unremarkable. Limited view of the skeleton and chest wall is unremarkable. IMPRESSION: No significant extracardiac findings. Electronically Signed: By: Suzy Bouchard M.D. On: 10/04/2021 12:20   DG Chest Portable 1 View  Result Date: 10/03/2021 CLINICAL DATA:  Chest pain EXAM: PORTABLE CHEST 1 VIEW COMPARISON:  04/29/2020 FINDINGS: Heart size and mediastinal contours are within normal limits. No pleural effusion or edema identified. No airspace opacities. The visualized osseous structures appear intact. IMPRESSION: No acute cardiopulmonary abnormalities. Electronically Signed   By: Kerby Moors M.D.   On: 10/03/2021 13:59   MR CARDIAC MORPHOLOGY W WO CONTRAST  Result Date: 10/05/2021 CLINICAL DATA:  Clinical question of myocarditis Study assumes HCT of 41 and BSA of 1.69 m2. EXAM: CARDIAC MRI TECHNIQUE: The patient was scanned on a 1.5 Tesla GE magnet. A dedicated cardiac coil was used. Functional imaging was done using [employer] sequences. 2,3, and 4 chamber views were done to assess for RWMA's. Modified Simpson's rule using a short axis stack was used to calculate an ejection fraction on a dedicated work Conservation officer, nature. The patient received 6 cc of Gadavist. After 10 minutes inversion recovery sequences were used to assess for infiltration and scar tissue. CONTRAST:  6 cc  of Gadavist FINDINGS: 1. Normal left ventricular size, with LVEDD 50 mm, and LVEDVi 73 mL/m2. Normal left ventricular thickness, with intraventricular septal thickness of 8 mm, posterior wall thickness  of 7 mm, and myocardial mass index of 46 g/m2. Normal left ventricular systolic function (LVEF = 62%). There are no regional wall motion abnormalities. Left ventricular parametric mapping notable for normal T2 and ECV signal. There is late gadolinium enhancement in the left ventricular myocardium: very small apical septal transmural LGE seen on short axis stack and two chamber PSIR imaging. 2. Mild right ventricular dilation with RVEDVI 90 mL/m2, RVEDV 150 mL (ULN is 141 mL). Normal right ventricular thickness. Normal right ventricular systolic function (RVEF =66%). There are no regional wall motion abnormalities or aneurysms. 3.  Normal left and right atrial size. 4. Normal size of the aortic root, ascending aorta and pulmonary artery. 5. Valve assessment: Aortic Valve: Tri-leaflet aortic valve. Qualitatively, there is no significant regurgitation. Pulmonic Valve: Qualitatively, there is no significant regurgitation. Tricuspid Valve: Qualitatively,  there is no significant regurgitation. Mitral Valve: There is no significant regurgitation. 6.  Normal pericardium.  No pericardial effusion. 7. Grossly, no extracardiac findings. Recommended dedicated study if concerned for non-cardiac pathology. IMPRESSION: Modified [facility] Criteria is not met for myocarditis. 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 Reviewed with primary team. Rudean Haskell MD Electronically Signed   By: Rudean Haskell M.D.   On: 10/05/2021 09:15   ECHOCARDIOGRAM COMPLETE  Result Date: 10/03/2021    ECHOCARDIOGRAM REPORT   Patient Name:   Anne Landry Date of Exam: 10/03/2021 Medical Rec #:  [id number]         Height:       66.0 in Accession #:    [id number]        Weight:       135.0 lb Date of Birth:  [birth year]         BSA:          1.692 m Patient Age:    43 years          BP:           137/126 mmHg Patient Gender: F                 HR:           76 bpm. Exam Location:  Inpatient Procedure: 2D  Echo, Cardiac Doppler and Color Doppler                           STAT ECHO Reported to: Dr Rudean Haskell on 10/03/2021 4:50:00 PM. Indications:    Chest pain  History:        Patient has no prior history of Echocardiogram examinations.                 Risk Factors:Non-Smoker.  Sonographer:    Clayton Lefort RDCS (AE) Referring Phys: [id number] [place]  1. Left ventricular ejection fraction, by estimation, is 55 to 60%. The left ventricle has normal function. The left ventricle has no regional wall motion abnormalities. There is mild left ventricular hypertrophy. Left ventricular diastolic parameters are consistent with Grade I diastolic dysfunction (impaired relaxation).  2. Right ventricular systolic function is normal. The right ventricular size is normal. There is normal pulmonary artery systolic pressure.  3. The mitral valve is normal in structure. No evidence of mitral valve regurgitation. No evidence of mitral stenosis.  4. The aortic valve was not well visualized. Aortic valve regurgitation is not visualized.  5. The inferior vena cava is normal in size with greater than 50% respiratory variability, suggesting right atrial pressure of 3 mmHg. Comparison(s): No prior Echocardiogram. FINDINGS  Left Ventricle: Left ventricular ejection fraction, by estimation, is 55 to 60%. The left ventricle has normal function. The left ventricle has no regional wall motion abnormalities. The left ventricular internal cavity size was normal in size. There is  mild left ventricular hypertrophy. Left ventricular diastolic parameters are consistent with Grade I diastolic dysfunction (impaired relaxation). Right Ventricle: The right ventricular size is normal. No increase in right ventricular wall thickness. Right ventricular systolic function is normal. There is normal pulmonary artery systolic pressure. The tricuspid regurgitant velocity is 1.90 m/s, and  with an assumed right atrial pressure of 3 mmHg,  the estimated right ventricular systolic pressure is 25.4 mmHg. Left Atrium: Left atrial size was normal in size. Right Atrium: Right atrial size was normal in size.  Pericardium: Trivial pericardial effusion is present. Mitral Valve: The mitral valve is normal in structure. No evidence of mitral valve regurgitation. No evidence of mitral valve stenosis. Tricuspid Valve: The tricuspid valve is normal in structure. Tricuspid valve regurgitation is trivial. No evidence of tricuspid stenosis. Aortic Valve: The aortic valve was not well visualized. Aortic valve regurgitation is not visualized. Aortic valve mean gradient measures 3.0 mmHg. Aortic valve peak gradient measures 5.6 mmHg. Aortic valve area, by VTI measures 1.68 cm. Pulmonic Valve: The pulmonic valve was not well visualized. Pulmonic valve regurgitation is not visualized. No evidence of pulmonic stenosis. Aorta: The aortic root and ascending aorta are structurally normal, with no evidence of dilitation. Venous: The inferior vena cava is normal in size with greater than 50% respiratory variability, suggesting right atrial pressure of 3 mmHg. IAS/Shunts: No atrial level shunt detected by color flow Doppler.  LEFT VENTRICLE PLAX 2D LVIDd:         4.20 cm   Diastology LVIDs:         2.50 cm   LV e' medial:    8.49 cm/s LV PW:         1.10 cm   LV E/e' medial:  10.2 LV IVS:        1.10 cm   LV e' lateral:   9.90 cm/s LVOT diam:     1.90 cm   LV E/e' lateral: 8.7 LV SV:         42 LV SV Index:   25 LVOT Area:     2.84 cm  RIGHT VENTRICLE             IVC RV Basal diam:  2.50 cm     IVC diam: 2.00 cm RV S prime:     18.10 cm/s TAPSE (M-mode): 3.9 cm LEFT ATRIUM           Index        RIGHT ATRIUM           Index LA diam:      2.30 cm 1.36 cm/m   RA Area:     10.90 cm LA Vol (A2C): 26.9 ml 15.90 ml/m  RA Volume:   21.50 ml  12.71 ml/m LA Vol (A4C): 41.8 ml 24.70 ml/m  AORTIC VALVE AV Area (Vmax):    2.11 cm AV Area (Vmean):   1.89 cm AV Area (VTI):     1.68  cm AV Vmax:           118.00 cm/s AV Vmean:          85.700 cm/s AV VTI:            0.250 m AV Peak Grad:      5.6 mmHg AV Mean Grad:      3.0 mmHg LVOT Vmax:         87.90 cm/s LVOT Vmean:        57.000 cm/s LVOT VTI:          0.148 m LVOT/AV VTI ratio: 0.59  AORTA Ao Root diam: 2.80 cm Ao Asc diam:  2.70 cm MITRAL VALVE               TRICUSPID VALVE MV Area (PHT): 4.19 cm    TR Peak grad:   14.4 mmHg MV Decel Time: 181 msec    TR Vmax:        190.00 cm/s MV E velocity: 86.50 cm/s MV A velocity: 98.50 cm/s  SHUNTS MV E/A ratio:  0.88        Systemic VTI:  0.15 m                            Systemic Diam: 1.90 cm Rudean Haskell MD Electronically signed by Rudean Haskell MD Signature Date/Time: 10/03/2021/5:09:49 PM    Final     Disposition   Pt is being discharged home today in good condition.  Follow-up Plans & Appointments     Follow-up Information     Cari Caraway, MD. Go on 10/13/2021.   Specialty: Family Medicine Why: '@2' :30pm Contact information: [place] [address] [phone]         Charlie Pitter, PA-C Follow up on 10/12/2021.   Specialties: Cardiology, Radiology Why: at 10am for your follow up appt with Dr. Hassell Done PA [facility] Contact information: [address] [place] [address] [phone]                Discharge Instructions     Diet - low sodium heart healthy   Complete by: As directed    Increase activity slowly   Complete by: As directed        Discharge Medications   Allergies as of 10/05/2021   No Known Allergies      Medication List     TAKE these medications    aspirin EC 81 MG tablet Take 1 tablet (81 mg total) by mouth daily. Swallow whole. Start taking on: Oct 06, 2021   Magnesium 100 MG Tabs Take 100 mg by mouth daily.   meloxicam 15 MG tablet Commonly known as: MOBIC Take 15 mg by mouth daily as needed for pain.   MULTIVITAMIN PO Take 1 tablet by mouth daily.    Omega-3 1000 MG Caps Take 1,000 mg by mouth daily.   rosuvastatin 20 MG tablet Commonly known as: Crestor Take 1 tablet (20 mg total) by mouth daily.   tretinoin 0.025 % cream Commonly known as: RETIN-A Apply 1 application. topically daily as needed (skin spots).           Outstanding Labs/Studies   FLP/LFTs in 8 weeks   Duration of Discharge Encounter   Greater than 30 minutes including physician time.  Signed, Reino Bellis, NP 10/05/2021, 11:35 AM  I have examined the patient and reviewed assessment and plan and discussed with patient.  Agree with above as stated.    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  There was a defect noted in that distribution on the MRI.  CTA did not show any evidence of dissection in the major vessels.  The small septal perforator may be too small to actually show up as scattered on the CT scan.  No need for invasive testing.  Normal LVEF.  Plan to treat her with aspirin 81 mg daily.  No clopidogrel at this time as we do not want to propogate any bleeding in the vessel wall.   Hyperlipidemia: LDL over 160.  We will start Crestor 20 mg daily for the anti-inflammatory effect of the statin.  Calcium score 0 based on CTA.   Discussed findings with the patient and her daughter.  Discussed the fact that she should avoid heavy lifting and straining.  Cardio type exercises are okay.  Light weights with multiple repetitions where she can breathe through the exercise are fine.   No beta blocker.  She had some borderline low HR.  Larae Grooms

## 2021-10-05 NOTE — Telephone Encounter (Signed)
**Note De-identified Anne Landry Obfuscation** -----  **Note De-Identified Anne Landry Obfuscation** Message from Arty Baumgartner, NP sent at 10/05/2021 11:36 AM EDT ----- Regarding: TOC call Needs TOC call please

## 2021-10-05 NOTE — Progress Notes (Addendum)
Progress Note  Patient Name: Anne Landry Date of Encounter: 10/05/2021  [facility] Cardiologist: None Michele Judy  Subjective   Feels well.   Inpatient Medications    Scheduled Meds:  aspirin EC  81 mg Oral Daily   metoprolol tartrate  50 mg Oral Once   Continuous Infusions:  PRN Meds:    Vital Signs    Vitals:   10/04/21 0848 10/04/21 2121 10/05/21 0632 10/05/21 0737  BP: 126/70 112/67 121/81 131/81  Pulse: 71 72 60 60  Resp:  _0 Temp: 97.7 F (36.5 C) 98.6 F (37 C) 98.5 F (36.9 C) 97.8 F (36.6 C)  TempSrc: Oral Oral Oral Oral  SpO2: 100% 100% 99% 98%  Weight:   59.9 kg   Height:        Intake/Output Summary (Last 24 hours) at 10/05/2021 0920 Last data filed at 10/04/2021 2000 Gross per 24 hour  Intake 240 ml  Output --  Net 240 ml      10/05/2021    6:32 AM 10/04/2021    4:34 AM 10/03/2021    6:41 PM  Last 3 Weights  Weight (lbs) 132 lb 131 lb 14.4 oz 130 lb 4.7 oz  Weight (kg) 59.875 kg 59.829 kg 59.1 kg      Telemetry    NSR - Personally Reviewed  ECG      Physical Exam   GEN: No acute distress.   Neck: No JVD Cardiac: RRR, no murmurs, rubs, or gallops.  Respiratory: Clear to auscultation bilaterally. GI: Soft, nontender, non-distended  MS: No edema; No deformity. Neuro:  Nonfocal  Psych: Normal affect   Labs    High Sensitivity Troponin:   Recent Labs  Lab 10/03/21 1047 10/03/21 1315 10/03/21 1611 10/03/21 1808  TROPONINIHS 27* 116* 308* 480*     Chemistry Recent Labs  Lab 10/03/21 1047 10/04/21 0412  NA 139 137  K 3.7 3.8  CL 104 104  CO2 28 28  GLUCOSE 130* 97  BUN 19 19  CREATININE 0.90 0.92  CALCIUM 9.3 9.3  PROT  --  6.5  ALBUMIN  --  3.6  AST  --  28  ALT  --  19  ALKPHOS  --  44  BILITOT  --  0.7  GFRNONAA >60 >60  ANIONGAP 7 5    Lipids No results for input(s): CHOL, TRIG, HDL, LABVLDL, LDLCALC, CHOLHDL in the last 168 hours.  Hematology Recent Labs  Lab 10/03/21 1047  WBC 5.5   RBC 4.57  HGB 13.8  HCT 40.6  MCV 88.8  MCH 30.2  MCHC 34.0  RDW 12.5  PLT 200   Thyroid No results for input(s): TSH, FREET4 in the last 168 hours.  BNPNo results for input(s): BNP, PROBNP in the last 168 hours.  DDimer No results for input(s): DDIMER in the last 168 hours.   Radiology    CT CORONARY MORPH W/CTA COR W/SCORE W/CA W/CM &/OR WO/CM  Addendum Date: 10/04/2021   ADDENDUM REPORT: 10/04/2021 13:44 CLINICAL DATA:  56 Year old White Female EXAM: Cardiac/Coronary  CTA TECHNIQUE: The patient was scanned on a [employer]. FINDINGS: Scan was triggered in the descending thoracic aorta. Axial non-contrast 3 mm slices were carried out through the heart. The data set was analyzed on a dedicated work station and scored using the [place]. Gantry rotation speed was 250 msecs and collimation was .6 mm. 0.8 mg of sl NTG was given. The 3D data set was  reconstructed in 5% intervals of the 67-82 % of the R-R cycle. Diastolic phases were analyzed on a dedicated work station using MPR, MIP and VRT modes. The patient received 100 cc of contrast. Coronary Arteries:  Normal coronary origin.  Right dominance. Coronary Calcium Score: Left main: 0 Left anterior descending artery: 0 Left circumflex artery: 0 Right coronary artery: 0 Total: 0 Percentile: 1st for age, sex, and race matched control. RCA is a large dominant artery that gives rise to PDA and PLA. Stair step artifact proximal and mid RCA. Left main is a large artery that gives rise to LAD and LCX arteries. There is no significant plaque. LAD is a large vessel that gives rise to one large D1 Branch. There is no significant plaque. Stair step artifact mid LAD. LCX is a non-dominant artery that gives rise to two OM vessels branch. There is no significant plaque. Stair step artifact mid LCX. Small myocardial bridge OM1 bifurcation. Other findings: Aorta: Normal size.  No calcifications.  No dissection. Main Pulmonary Artery: Normal size of  the pulmonary artery. Aortic Valve:  Tri-leaflet.  No calcifications. Normal pulmonary vein drainage into the left atrium. Normal left atrial appendage without a thrombus. Interatrial septum with no communications. Extra-cardiac findings: See attached radiology report for non-cardiac structures. Artifact: Cardiac motion slab artifact. IMPRESSION: 1. Coronary calcium score of 0. This was 1st percentile for age, sex, and race matched control. 2. Normal coronary origin with right dominance. 3. CAD-RADS 0. No evidence of CAD (0%). Consider non-atherosclerotic causes of chest pain. RECOMMENDATIONS: Coronary artery calcium (CAC) score is a strong predictor of incident coronary heart disease (CHD) and provides predictive information beyond traditional risk factors. CAC scoring is reasonable to use in the decision to withhold, postpone, or initiate statin therapy in intermediate-risk or selected borderline-risk asymptomatic adults (age 21-75 years and LDL-C >=70 to <190 mg/dL) who do not have diabetes or established atherosclerotic cardiovascular disease (ASCVD).* In intermediate-risk (10-year ASCVD risk >=7.5% to <20%) adults or selected borderline-risk (10-year ASCVD risk >=5% to <7.5%) adults in whom a CAC score is measured for the purpose of making a treatment decision the following recommendations have been made: If CAC = 0, it is reasonable to withhold statin therapy and reassess in 5 to 10 years, as long as higher risk conditions are absent (diabetes mellitus, family history of premature CHD in first degree relatives (males <55 years; females <65 years), cigarette smoking, LDL >=190 mg/dL or other independent risk factors). If CAC is 1 to 99, it is reasonable to initiate statin therapy for patients >=104 years of age. If CAC is >=100 or >=75th percentile, it is reasonable to initiate statin therapy at any age. Cardiology referral should be considered for patients with CAC scores =400 or >=75th percentile. *2018  AHA/ACC/AACVPR/AAPA/ABC/ACPM/ADA/AGS/APhA/ASPC/NLA/PCNA Guideline on the Management of Blood Cholesterol: A Report of the American College of Cardiology/American Heart Association Task Force on Clinical Practice Guidelines. J Am Coll Cardiol. 2019;73(24):3168-3209. Rudean Haskell, MD Electronically Signed   By: Rudean Haskell M.D.   On: 10/04/2021 13:44   Result Date: 10/04/2021 EXAM: OVER-READ INTERPRETATION  CT CHEST The following report is a limited chest CT over-read performed by radiologist Dr. Suzy Bouchard of [facility], [place] on 10/04/2021. The CTA interpretation by the cardiologist is attached. COMPARISON:  None Available. FINDINGS: Limited view of the lung parenchyma demonstrates no suspicious nodularity. Airways are normal. Limited view of the mediastinum demonstrates no adenopathy. Esophagus normal. Limited view of the upper abdomen unremarkable. Limited view  of the skeleton and chest wall is unremarkable. IMPRESSION: No significant extracardiac findings. Electronically Signed: By: Suzy Bouchard M.D. On: 10/04/2021 12:20   DG Chest Portable 1 View  Result Date: 10/03/2021 CLINICAL DATA:  Chest pain EXAM: PORTABLE CHEST 1 VIEW COMPARISON:  04/29/2020 FINDINGS: Heart size and mediastinal contours are within normal limits. No pleural effusion or edema identified. No airspace opacities. The visualized osseous structures appear intact. IMPRESSION: No acute cardiopulmonary abnormalities. Electronically Signed   By: Kerby Moors M.D.   On: 10/03/2021 13:59   MR CARDIAC MORPHOLOGY W WO CONTRAST  Result Date: 10/05/2021 CLINICAL DATA:  Clinical question of myocarditis Study assumes HCT of 41 and BSA of 1.69 m2. EXAM: CARDIAC MRI TECHNIQUE: The patient was scanned on a 1.5 Tesla GE magnet. A dedicated cardiac coil was used. Functional imaging was done using [employer] sequences. 2,3, and 4 chamber views were done to assess for RWMA's. Modified Simpson's rule using a short axis  stack was used to calculate an ejection fraction on a dedicated work Conservation officer, nature. The patient received 6 cc of Gadavist. After 10 minutes inversion recovery sequences were used to assess for infiltration and scar tissue. CONTRAST:  6 cc  of Gadavist FINDINGS: 1. Normal left ventricular size, with LVEDD 50 mm, and LVEDVi 73 mL/m2. Normal left ventricular thickness, with intraventricular septal thickness of 8 mm, posterior wall thickness of 7 mm, and myocardial mass index of 46 g/m2. Normal left ventricular systolic function (LVEF = 62%). There are no regional wall motion abnormalities. Left ventricular parametric mapping notable for normal T2 and ECV signal. There is late gadolinium enhancement in the left ventricular myocardium: very small apical septal transmural LGE seen on short axis stack and two chamber PSIR imaging. 2. Mild right ventricular dilation with RVEDVI 90 mL/m2, RVEDV 150 mL (ULN is 141 mL). Normal right ventricular thickness. Normal right ventricular systolic function (RVEF =97%). There are no regional wall motion abnormalities or aneurysms. 3.  Normal left and right atrial size. 4. Normal size of the aortic root, ascending aorta and pulmonary artery. 5. Valve assessment: Aortic Valve: Tri-leaflet aortic valve. Qualitatively, there is no significant regurgitation. Pulmonic Valve: Qualitatively, there is no significant regurgitation. Tricuspid Valve: Qualitatively, there is no significant regurgitation. Mitral Valve: There is no significant regurgitation. 6.  Normal pericardium.  No pericardial effusion. 7. Grossly, no extracardiac findings. Recommended dedicated study if concerned for non-cardiac pathology. IMPRESSION: Modified [facility] Criteria is not met for myocarditis. 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 Reviewed with primary team. Rudean Haskell MD Electronically Signed   By: Rudean Haskell M.D.   On: 10/05/2021 09:15    ECHOCARDIOGRAM COMPLETE  Result Date: 10/03/2021    ECHOCARDIOGRAM REPORT   Patient Name:   DORISMAR CHAY Date of Exam: 10/03/2021 Medical Rec #:  [id number]         Height:       66.0 in Accession #:    [id number]        Weight:       135.0 lb Date of Birth:  [birth year]         BSA:          1.692 m Patient Age:    56 years          BP:           137/126 mmHg Patient Gender: F  HR:           76 bpm. Exam Location:  Inpatient Procedure: 2D Echo, Cardiac Doppler and Color Doppler                           STAT ECHO Reported to: Dr Rudean Haskell on 10/03/2021 4:50:00 PM. Indications:    Chest pain  History:        Patient has no prior history of Echocardiogram examinations.                 Risk Factors:Non-Smoker.  Sonographer:    Clayton Lefort RDCS (AE) Referring Phys: [id number] [place]  1. Left ventricular ejection fraction, by estimation, is 55 to 60%. The left ventricle has normal function. The left ventricle has no regional wall motion abnormalities. There is mild left ventricular hypertrophy. Left ventricular diastolic parameters are consistent with Grade I diastolic dysfunction (impaired relaxation).  2. Right ventricular systolic function is normal. The right ventricular size is normal. There is normal pulmonary artery systolic pressure.  3. The mitral valve is normal in structure. No evidence of mitral valve regurgitation. No evidence of mitral stenosis.  4. The aortic valve was not well visualized. Aortic valve regurgitation is not visualized.  5. The inferior vena cava is normal in size with greater than 50% respiratory variability, suggesting right atrial pressure of 3 mmHg. Comparison(s): No prior Echocardiogram. FINDINGS  Left Ventricle: Left ventricular ejection fraction, by estimation, is 55 to 60%. The left ventricle has normal function. The left ventricle has no regional wall motion abnormalities. The left ventricular internal cavity size was normal in  size. There is  mild left ventricular hypertrophy. Left ventricular diastolic parameters are consistent with Grade I diastolic dysfunction (impaired relaxation). Right Ventricle: The right ventricular size is normal. No increase in right ventricular wall thickness. Right ventricular systolic function is normal. There is normal pulmonary artery systolic pressure. The tricuspid regurgitant velocity is 1.90 m/s, and  with an assumed right atrial pressure of 3 mmHg, the estimated right ventricular systolic pressure is 41.9 mmHg. Left Atrium: Left atrial size was normal in size. Right Atrium: Right atrial size was normal in size. Pericardium: Trivial pericardial effusion is present. Mitral Valve: The mitral valve is normal in structure. No evidence of mitral valve regurgitation. No evidence of mitral valve stenosis. Tricuspid Valve: The tricuspid valve is normal in structure. Tricuspid valve regurgitation is trivial. No evidence of tricuspid stenosis. Aortic Valve: The aortic valve was not well visualized. Aortic valve regurgitation is not visualized. Aortic valve mean gradient measures 3.0 mmHg. Aortic valve peak gradient measures 5.6 mmHg. Aortic valve area, by VTI measures 1.68 cm. Pulmonic Valve: The pulmonic valve was not well visualized. Pulmonic valve regurgitation is not visualized. No evidence of pulmonic stenosis. Aorta: The aortic root and ascending aorta are structurally normal, with no evidence of dilitation. Venous: The inferior vena cava is normal in size with greater than 50% respiratory variability, suggesting right atrial pressure of 3 mmHg. IAS/Shunts: No atrial level shunt detected by color flow Doppler.  LEFT VENTRICLE PLAX 2D LVIDd:         4.20 cm   Diastology LVIDs:         2.50 cm   LV e' medial:    8.49 cm/s LV PW:         1.10 cm   LV E/e' medial:  10.2 LV IVS:        1.10  cm   LV e' lateral:   9.90 cm/s LVOT diam:     1.90 cm   LV E/e' lateral: 8.7 LV SV:         42 LV SV Index:   25 LVOT  Area:     2.84 cm  RIGHT VENTRICLE             IVC RV Basal diam:  2.50 cm     IVC diam: 2.00 cm RV S prime:     18.10 cm/s TAPSE (M-mode): 3.9 cm LEFT ATRIUM           Index        RIGHT ATRIUM           Index LA diam:      2.30 cm 1.36 cm/m   RA Area:     10.90 cm LA Vol (A2C): 26.9 ml 15.90 ml/m  RA Volume:   21.50 ml  12.71 ml/m LA Vol (A4C): 41.8 ml 24.70 ml/m  AORTIC VALVE AV Area (Vmax):    2.11 cm AV Area (Vmean):   1.89 cm AV Area (VTI):     1.68 cm AV Vmax:           118.00 cm/s AV Vmean:          85.700 cm/s AV VTI:            0.250 m AV Peak Grad:      5.6 mmHg AV Mean Grad:      3.0 mmHg LVOT Vmax:         87.90 cm/s LVOT Vmean:        57.000 cm/s LVOT VTI:          0.148 m LVOT/AV VTI ratio: 0.59  AORTA Ao Root diam: 2.80 cm Ao Asc diam:  2.70 cm MITRAL VALVE               TRICUSPID VALVE MV Area (PHT): 4.19 cm    TR Peak grad:   14.4 mmHg MV Decel Time: 181 msec    TR Vmax:        190.00 cm/s MV E velocity: 86.50 cm/s MV A velocity: 98.50 cm/s  SHUNTS MV E/A ratio:  0.88        Systemic VTI:  0.15 m                            Systemic Diam: 1.90 cm Rudean Haskell MD Electronically signed by Rudean Haskell MD Signature Date/Time: 10/03/2021/5:09:49 PM    Final     Cardiac Studies   I personally reviewed the cardiac MRI images and the cardiac CT images with Dr. Glenford Bayley  Patient Profile     56 y.o. 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  There was a defect noted in that distribution on the MRI.  CTA did not show any evidence of dissection in the major vessels.  The small septal perforator may be too small to actually show up as scattered on the CT scan.  No need for invasive testing.  Normal LVEF.  Plan to treat her with aspirin 81 mg daily.  No clopidogrel at this time.  Hyperlipidemia: LDL over 160.  We will start Crestor 20 mg daily for the anti-inflammatory effect of the statin.   Calcium score 0 based on CTA.  Discussed findings with the patient and her  daughter.  Discussed the fact that she should avoid heavy lifting and straining.  Cardio type exercises are okay.  Light weights with multiple repetitions where she can breathe through the exercise are fine.  No beta blocker.  She had some borderline low HR.    For questions or updates, please contact [place] Please consult [email or web address] for contact info under        Signed, Larae Grooms, MD  10/05/2021, 9:20 AM

## 2021-10-06 NOTE — Telephone Encounter (Signed)
**Note De-Identified Anne Landry Obfuscation** Patient contacted regarding discharge from [facility] on 10/05/2021.  Patient understands to follow up with provider Melina Copa, PA-c on 10/12/2021 at 10:05 at [address], [place] in [place], [place] [address]. Patient understands discharge instructions? Yes Patient understands medications and regiment? Yes Patient understands to bring all medications to this visit? Yes Ask patient:  Are you enrolled in My Chart: Yes  The pt states that she has been doing well since she returned home from the hospital yesterday. She denies having any CP/discomfort, fatigue, weakness, nausea, diaphoresis, lightheadedness or dizziness.  She verified that she does have [facility]'s phone number to call if she has any questions or concerns. She thanked me for my call.

## 2021-10-10 ENCOUNTER — Encounter: Payer: Self-pay | Admitting: Physician Assistant

## 2021-10-10 NOTE — Progress Notes (Unsigned)
 Cardiology Office Note    Date:  10/12/2021   ID:  Anne Landry, DOB 12/27/1965, MRN [id number]  PCP:  McNeill, Wendy, MD  Cardiologist:  Jayadeep Varanasi, MD  Electrophysiologist:  None   Chief Complaint: TOC Visit follow up for NSTEMI  History of Present Illness:   Anne Landry is a 56 y.o. 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 She is very active and plays tennis. Recently presented to the hospital with CP that began while driving. Troponins were elevated to peak 480. CXR nonacute. She underwent cardiac CT which was negative for evidence of dissection in the major vessels. 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 It was felt the septal branch may be too small to be seen on CT. It was recommended to treat with ASA and statin. She was not started on BB due to baseline bradycardia. Echo showed EF 55-60%, mild LVH, g1DD, normal RV.  Anne Landry presents today for TOC visit following NSTEMI.  She is accompanied today at the visit with her husband.  She reports that she has been doing well and has no complaint of angina or similar chest pain that occurred with NSTEMI.  She did endorse a fluttery feeling over the left breast that she states may be somatic and not similar to her previous complaint.  She is compliant with her medications and has no report of myalgia with the Crestor. She is anxious to resume physical activity and we discussed the importance of abstaining from strenuous exercises that cause bearing down.Patient denies palpitations, dyspnea, PND, orthopnea, nausea, vomiting, dizziness, syncope, edema, weight gain, or early satiety.  Lab work independently reviewed: 09/2021 K 3.8, Cr 0.92, LFTs ok, troponins 27-116-308-480, hCG neg, CBC wnl 11/2020 LDL 162, trig 95  Cardiology Studies:   Studies reviewed  are outlined and summarized above. Reports included below if pertinent.   cMRI 09/2021 EXAM: CARDIAC MRI   TECHNIQUE: The patient was scanned on a 1.5 Tesla GE magnet. A dedicated cardiac coil was used. Functional imaging was done using Fiesta sequences. 2,3, and 4 chamber views were done to assess for RWMA's. Modified Simpson's rule using a short axis stack was used to calculate an ejection fraction on a dedicated work station using Circle software. The patient received 6 cc of Gadavist. After 10 minutes inversion recovery sequences were used to assess for infiltration and scar tissue.   CONTRAST:  6 cc  of Gadavist   FINDINGS: 1. Normal left ventricular size, with LVEDD 50 mm, and LVEDVi 73 mL/m2.   Normal left ventricular thickness, with intraventricular septal thickness of 8 mm, posterior wall thickness of 7 mm, and myocardial mass index of 46 g/m2.   Normal left ventricular systolic function (LVEF = 62%). There are no regional wall motion abnormalities.   Left ventricular parametric mapping notable for normal T2 and ECV signal.   There is late gadolinium enhancement in the left ventricular myocardium: very small apical septal transmural LGE seen on short axis stack and two chamber PSIR imaging.   2. Mild right ventricular dilation with RVEDVI 90 mL/m2, RVEDV 150 mL (ULN is 141 mL).   Normal right ventricular thickness.   Normal right ventricular systolic function (RVEF =52%). There are no regional wall motion abnormalities or aneurysms.   3.  Normal left and right atrial size.   4.   Normal size of the aortic root, ascending aorta and pulmonary artery.   5. Valve assessment:   Aortic Valve: Tri-leaflet aortic valve. Qualitatively, there is no significant regurgitation.   Pulmonic Valve: Qualitatively, there is no significant regurgitation.   Tricuspid Valve: Qualitatively, there is no significant regurgitation.   Mitral Valve: There is no significant  regurgitation.   6.  Normal pericardium.  No pericardial effusion.   7. Grossly, no extracardiac findings. Recommended dedicated study if concerned for non-cardiac pathology.   IMPRESSION: Modified [place] Criteria is not met for myocarditis.   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   Reviewed with primary team.   Mahesh Chandrasekhar MD     Electronically Signed   By: Mahesh   Chandrasekhar M.D.   On: 10/05/2021 09:15   Cor CT 10/04/21 EXAM: Cardiac/Coronary  CTA   TECHNIQUE: The patient was scanned on a Phillips Force scanner.   FINDINGS: Scan was triggered in the descending thoracic aorta. Axial non-contrast 3 mm slices were carried out through the heart. The data set was analyzed on a dedicated work station and scored using the Agatson method. Gantry rotation speed was 250 msecs and collimation was .6 mm. 0.8 mg of sl NTG was given. The 3D data set was reconstructed in 5% intervals of the 67-82 % of the R-R cycle. Diastolic phases were analyzed on a dedicated work station using MPR, MIP and VRT modes. The patient received 100 cc of contrast.   Coronary Arteries:  Normal coronary origin.  Right dominance.   Coronary Calcium Score:   Left main: 0   Left anterior descending artery: 0   Left circumflex artery: 0   Right coronary artery: 0   Total: 0   Percentile: 1st for age, sex, and race matched control.   RCA is a large dominant artery that gives rise to PDA and PLA. Stair step artifact proximal and mid RCA.   Left main is a large artery that gives rise to LAD and LCX arteries. There is no significant plaque.   LAD is a large vessel that gives rise to one large D1 Branch. There is no significant plaque. Stair step artifact mid LAD.   LCX is a non-dominant artery that gives rise to two OM vessels branch. There is no significant plaque. Stair step artifact mid LCX. Small myocardial bridge OM1 bifurcation.   Other  findings:   Aorta: Normal size.  No calcifications.  No dissection.   Main Pulmonary Artery: Normal size of the pulmonary artery.   Aortic Valve:  Tri-leaflet.  No calcifications.   Normal pulmonary vein drainage into the left atrium.   Normal left atrial appendage without a thrombus.   Interatrial septum with no communications.   Extra-cardiac findings: See attached radiology report for non-cardiac structures.   Artifact: Cardiac motion slab artifact.   IMPRESSION: 1. Coronary calcium score of 0. This was 1st percentile for age, sex, and race matched control.   2. Normal coronary origin with right dominance.   3. CAD-RADS 0. No evidence of CAD (0%). Consider non-atherosclerotic causes of chest pain.  2D echo 10/03/21    1. Left ventricular ejection fraction, by estimation, is 55 to 60%. The  left ventricle has normal function. The left ventricle has no regional  wall motion abnormalities. There is mild left ventricular hypertrophy.  Left ventricular diastolic parameters  are consistent with Grade I diastolic dysfunction (impaired relaxation).   2. Right ventricular systolic function is normal. The right ventricular    size is normal. There is normal pulmonary artery systolic pressure.   3. The mitral valve is normal in structure. No evidence of mitral valve  regurgitation. No evidence of mitral stenosis.   4. The aortic valve was not well visualized. Aortic valve regurgitation  is not visualized.   5. The inferior vena cava is normal in size with greater than 50%  respiratory variability, suggesting right atrial pressure of 3 mmHg.   Comparison(s): No prior Echocardiogram.     Past Medical History:  Diagnosis Date   Hyperlipidemia    Insomnia    klonapin   Migraine without aura    NSTEMI (non-ST elevated myocardial infarction) (HCC)    Spontaneous dissection of coronary artery     No past surgical history on file.  Current Medications: Current Meds   Medication Sig   aspirin EC 81 MG tablet Take 1 tablet (81 mg total) by mouth daily. Swallow whole.   Magnesium 100 MG TABS Take 100 mg by mouth daily.   meloxicam (MOBIC) 15 MG tablet Take 15 mg by mouth daily as needed for pain.   Multiple Vitamins-Minerals (MULTIVITAMIN PO) Take 1 tablet by mouth daily.   Omega-3 1000 MG CAPS Take 1,000 mg by mouth daily.   rosuvastatin (CRESTOR) 20 MG tablet Take 1 tablet (20 mg total) by mouth daily.   tretinoin (RETIN-A) 0.025 % cream Apply 1 application. topically daily as needed (skin spots).      Allergies:   Patient has no known allergies.   Social History   Socioeconomic History   Marital status: Married    Spouse name: Not on file   Number of children: Not on file   Years of education: Not on file   Highest education level: Not on file  Occupational History   Not on file  Tobacco Use   Smoking status: Never   Smokeless tobacco: Never  Vaping Use   Vaping Use: Never used  Substance and Sexual Activity   Alcohol use: Yes    Comment: less than 1   Drug use: No   Sexual activity: Yes    Partners: Male    Comment: Husband has vasectomy  Other Topics Concern   Not on file  Social History Narrative   Not on file   Social Determinants of Health   Financial Resource Strain: Not on file  Food Insecurity: Not on file  Transportation Needs: Not on file  Physical Activity: Not on file  Stress: Not on file  Social Connections: Not on file     Family History:  The patient's family history includes Crohn's disease in her father; Hypertension in her mother; Ovarian cancer in her maternal aunt.  ROS:   Please see the history of present illness. Otherwise, review of systems is negative.  All other systems are reviewed and otherwise negative.    EKG(s)/Additional Labs   EKG:  EKG is ordered today, personally reviewed, demonstrating normal sinus rhythm with rate of 70 bpm no acute changes noted  Recent Labs: 10/03/2021:  Hemoglobin 13.8; Platelets 200 10/04/2021: ALT 19; BUN 19; Creatinine, Ser 0.92; Potassium 3.8; Sodium 137  Recent Lipid Panel    Component Value Date/Time   CHOL 253 (H) 11/17/2020 1018   CHOL 244 (H) 11/11/2019 1650   TRIG 95 11/17/2020 1018   HDL 71 11/17/2020 1018   HDL 71 11/11/2019 1650   CHOLHDL 3.6 11/17/2020 1018   VLDL 20 09/12/2016 1631   LDLCALC 162 (H) 11/17/2020 1018    PHYSICAL   EXAM:    VS:  BP 110/80   Pulse 70   Ht 5' 6" (1.676 m)   Wt 134 lb (60.8 kg)   LMP 06/24/2015   SpO2 98%   BMI 21.63 kg/m   BMI: Body mass index is 21.63 kg/m.  GEN: Well nourished, well developed female in no acute distress HEENT: normocephalic, atraumatic Neck: no JVD, carotid bruits, or masses Cardiac: RRR; no murmurs, rubs, or gallops, no edema  Respiratory:  clear to auscultation bilaterally, normal work of breathing GI: soft, nontender, nondistended, + BS MS: no deformity or atrophy Skin: warm and dry, no rash Neuro:  Alert and Oriented x 3, Strength and sensation are intact, follows commands Psych: euthymic mood, full affect  Wt Readings from Last 3 Encounters:  10/12/21 134 lb (60.8 kg)  10/05/21 132 lb (59.9 kg)  11/17/20 130 lb (59 kg)     ASSESSMENT & PLAN:   NSTEMI: - s/p cardiac CT that was negative for dissection of major vessels.  However cardiac MRI noted a defect of septal perforator felt to be too small to be seen on CT. This was felt to represent possible SCAD.  -Stable with no anginal symptoms.today. -We discussed at length the importance of abstaining from strenuous exercise such as weight lifting and extreme aerobic conditioning but moderate physical activity is encouraged -EKG completed today with normal sinus rhythm and no acute changes -Continue GDMT with Crestor 20 mg, and ASA 81 mg, patient not on beta-blocker due to low heart rate and blood pressure.  2. Spontaneous dissection of coronary artery: -We discussed fibromuscular dysplasia and the  spontaneous conditions that may occur such as SCAD -Plan screening carotid ultrasound and renal duplex to evaluate for concomitant fibromuscular dysplasia   3.  Hyperlipidemia: -LDL completed 11/2020 at 162 greater than goal of less than 70 -LFTs and fasting lipids in 6 weeks -Continue heart healthy diet and exercise as tolerated     Cardiac Rehabilitation Eligibility Assessment  The patient is ready to start cardiac rehabilitation from a cardiac standpoint.    Disposition: F/u with Dr.Varanasi in 3 months  Medication Adjustments/Labs and Tests Ordered: Current medicines are reviewed at length with the patient today.  Concerns regarding medicines are outlined above. Medication changes, Labs and Tests ordered today are summarized above and listed in the Patient Instructions accessible in Encounters.   Signed, Dick Jr, Ernest Henry, NP  10/12/2021 11:13 AM    [facility] Phone: [phone]; Fax: [phone]  

## 2021-10-12 ENCOUNTER — Ambulatory Visit (INDEPENDENT_AMBULATORY_CARE_PROVIDER_SITE_OTHER): Payer: 59 | Admitting: Nurse Practitioner

## 2021-10-12 ENCOUNTER — Encounter: Payer: Self-pay | Admitting: Nurse Practitioner

## 2021-10-12 VITALS — BP 110/80 | HR 70 | Ht 66.0 in | Wt 134.0 lb

## 2021-10-12 DIAGNOSIS — I2542 Coronary artery dissection: Secondary | ICD-10-CM | POA: Diagnosis not present

## 2021-10-12 DIAGNOSIS — E785 Hyperlipidemia, unspecified: Secondary | ICD-10-CM | POA: Diagnosis not present

## 2021-10-12 DIAGNOSIS — I214 Non-ST elevation (NSTEMI) myocardial infarction: Secondary | ICD-10-CM

## 2021-10-12 NOTE — Patient Instructions (Addendum)
Medication Instructions:  Your physician recommends that you continue on your current medications as directed. Please refer to the Current Medication list given to you today. *If you need a refill on your cardiac medications before your next appointment, please call your pharmacy*   Lab Work: Schedule lab appointment: 6 weeks FASTING LFTs & LIPIDS If you have labs (blood work) drawn today and your tests are completely normal, you will receive your results only by: [place] (if you have MyChart) OR A paper copy in the mail If you have any lab test that is abnormal or we need to change your treatment, we will call you to review the results.   Testing/Procedures: Your physician has requested that you have a carotid duplex. This test is an ultrasound of the carotid arteries in your neck. It looks at blood flow through these arteries that supply the brain with blood. Allow one hour for this exam. There are no restrictions or special instructions.  Your physician has requested that you have a renal artery duplex. During this test, an ultrasound is used to evaluate blood flow to the kidneys. Allow one hour for this exam. Do not eat after midnight the day before and avoid carbonated beverages. Take your medications as you usually do.    Follow-Up: At [facility], you and your health needs are our priority.  As part of our continuing mission to provide you with exceptional heart care, we have created designated Provider Care Teams.  These Care Teams include your primary Cardiologist (physician) and Advanced Practice Providers (APPs -  Physician Assistants and Nurse Practitioners) who all work together to provide you with the care you need, when you need it.  We recommend signing up for the patient portal called "MyChart".  Sign up information is provided on this After Visit Summary.  MyChart is used to connect with patients for Virtual Visits (Telemedicine).  Patients are able to view  lab/test results, encounter notes, upcoming appointments, etc.  Non-urgent messages can be sent to your provider as well.   To learn more about what you can do with MyChart, go to [email or web address].    Your next appointment:   3 month(s)  The format for your next appointment:   In Person  Provider:   Larae Grooms, MD    Other Instructions   Important Information About Sugar

## 2021-10-16 ENCOUNTER — Telehealth (HOSPITAL_COMMUNITY): Payer: Self-pay

## 2021-10-16 NOTE — Telephone Encounter (Signed)
Called patient to see if she is interested in the Cardiac Rehab Program. Patient expressed interest. Explained scheduling process, patient verbalized understanding. Will contact patient for scheduling once f/u has been completed. Adv pt of CR wait-list 1-2 months.

## 2021-10-21 ENCOUNTER — Emergency Department (HOSPITAL_COMMUNITY): Payer: 59

## 2021-10-21 ENCOUNTER — Emergency Department (HOSPITAL_COMMUNITY)
Admission: EM | Admit: 2021-10-21 | Discharge: 2021-10-21 | Disposition: A | Discharge status: Home / Self Care | Payer: 59 | Attending: Emergency Medicine | Admitting: Emergency Medicine

## 2021-10-21 ENCOUNTER — Other Ambulatory Visit: Payer: Self-pay

## 2021-10-21 ENCOUNTER — Encounter (HOSPITAL_COMMUNITY): Payer: Self-pay

## 2021-10-21 DIAGNOSIS — M79609 Pain in unspecified limb: Secondary | ICD-10-CM

## 2021-10-21 DIAGNOSIS — Z7982 Long term (current) use of aspirin: Secondary | ICD-10-CM | POA: Diagnosis not present

## 2021-10-21 DIAGNOSIS — R200 Anesthesia of skin: Secondary | ICD-10-CM | POA: Diagnosis present

## 2021-10-21 DIAGNOSIS — R202 Paresthesia of skin: Secondary | ICD-10-CM | POA: Diagnosis not present

## 2021-10-21 LAB — TROPONIN I (HIGH SENSITIVITY)
Troponin I (High Sensitivity): 5 ng/L (ref <18)
Troponin I (High Sensitivity): 5 ng/L (ref <18)

## 2021-10-21 LAB — CBC
HCT: 40.7 % (ref 36.0–46.0)
Hemoglobin: 13.8 g/dL (ref 12.0–15.0)
MCH: 30.1 pg (ref 26.0–34.0)
MCHC: 33.9 g/dL (ref 30.0–36.0)
MCV: 88.9 fL (ref 80.0–100.0)
Platelets: 218 10*3/uL (ref 150–400)
RBC: 4.58 MIL/uL (ref 3.87–5.11)
RDW: 12.1 % (ref 11.5–15.5)
WBC: 7.2 10*3/uL (ref 4.0–10.5)
nRBC: 0 % (ref 0.0–0.2)

## 2021-10-21 LAB — BASIC METABOLIC PANEL
Anion gap: 10 (ref 5–15)
BUN: 18 mg/dL (ref 6–20)
CO2: 27 mmol/L (ref 22–32)
Calcium: 9.4 mg/dL (ref 8.9–10.3)
Chloride: 104 mmol/L (ref 98–111)
Creatinine, Ser: 0.85 mg/dL (ref 0.44–1.00)
GFR, Estimated: >60 mL/min (ref >60)
Glucose, Bld: 97 mg/dL (ref 70–99)
Potassium: 3.9 mmol/L (ref 3.5–5.1)
Sodium: 141 mmol/L (ref 135–145)

## 2021-10-21 NOTE — ED Provider Notes (Signed)
[facility] EMERGENCY DEPARTMENT Provider Note   CSN: [id number] Arrival date & time: 10/21/21  1240     History  No chief complaint on file.   Anne Landry is a 56 y.o. female.  HPI She presents for evaluation of pain and numbness in her left arm, started between 11 and 11:30 AM today.  Since then the discomfort has improved but she has mild residual numbness in the fingers of her left hand.  During this timeframe she is also noticed some on and off numbness in her fingers of her right hand.  She denies chest pain today.  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    Home Medications Prior to Admission medications   Medication Sig Start Date End Date Taking? Authorizing Provider  aspirin EC 81 MG tablet Take 1 tablet (81 mg total) by mouth daily. Swallow whole. 10/06/21  Yes Laverda Page B, NP  Magnesium 100 MG TABS Take 100 mg by mouth daily. 10/12/17  Yes [provider]  melatonin 3 MG TABS tablet Take 3 mg by mouth at bedtime.   Yes [provider]  Multiple Vitamins-Minerals (MULTIVITAMIN PO) Take 1 tablet by mouth daily.   Yes [provider]  Omega-3 1000 MG CAPS Take 1,000 mg by mouth daily. 10/12/17  Yes [provider]  Propylene Glycol (SYSTANE COMPLETE OP) Place 1 drop into both eyes daily as needed (dryness).   Yes [provider]  rosuvastatin (CRESTOR) 20 MG tablet Take 1 tablet (20 mg total) by mouth daily. 10/05/21 01/03/22 Yes Laverda Page B, NP  tretinoin (RETIN-A) 0.025 % cream Apply 1 application. topically daily as needed (skin spots). 08/22/15  Yes [provider]  VITAMIN D PO Take 1 tablet by mouth daily.   Yes [provider]      Allergies    Patient has no known allergies.    Review of Systems   Review of Systems  Physical  Exam Updated Vital Signs BP (!) 141/76   Pulse (!) 59   Temp 98.4 F (36.9 C) (Oral)   Resp 15   LMP 06/24/2015   SpO2 100%  Physical Exam  ED Results / Procedures / Treatments   Labs (all labs ordered are listed, but only abnormal results are displayed) Labs Reviewed  BASIC METABOLIC PANEL  CBC  TROPONIN I (HIGH SENSITIVITY)  TROPONIN I (HIGH SENSITIVITY)    EKG EKG Interpretation  Date/Time:  Saturday October 21 2021 13:36:02 EDT Ventricular Rate:  65 PR Interval:  164 QRS Duration: 98 QT Interval:  436 QTC Calculation: 453 R Axis:   83 Text Interpretation: Normal sinus rhythm with sinus arrhythmia Normal ECG When compared with ECG of 03-Oct-2021 10:50, PREVIOUS ECG IS PRESENT No significant change was found Confirmed by Mancel Bale [phone]) on 10/21/2021 4:16:27 PM  Radiology DG Chest Port 1 View  Result Date: 10/21/2021 CLINICAL DATA:  Chest pain, left arm numbness EXAM: PORTABLE CHEST 1 VIEW COMPARISON:  10/03/2021 FINDINGS: The heart size and mediastinal contours are within normal limits. Both lungs are clear. The visualized skeletal structures are unremarkable. IMPRESSION: No active disease. Electronically Signed   By: Ernie Avena M.D.   On: 10/21/2021 13:52    Procedures Procedures    Medications Ordered in ED Medications - No data to display  ED Course/ Medical Decision Making/ A&P Clinical  Course as of 10/22/21 1424  Sat Oct 21, 2021  1709 Extremity blood pressures checked by nursing- . 139 systolic right arm, 159 in the left [EW]    Clinical Course User Index [EW] Mancel Bale, MD                           Medical Decision Making Patient presenting with pain and tingling in the left arm, with her being concern for recurrent spontaneous dissection of coronary artery.  Problems Addressed: Paresthesia and pain of left extremity: acute illness or injury    Details: Nonspecific findings without evidence for acute coronary syndrome.  Amount  and/or Complexity of Data Reviewed Independent Historian:     Details: Cogent historian External Data Reviewed: notes.    Details: Hospitalized 3 weeks ago for chest pain, found to have ACS felt to be secondary to spontaneous dissection of the coronary artery.  She had comprehensive imaging at that time. Labs: ordered.    Details: CBC, metabolic panel, troponin-normal fine Radiology: ordered and independent interpretation performed.    Details: Chest x-ray-no infiltrate or edema ECG/medicine tests: ordered and independent interpretation performed.    Details: Normal sinus rhythm on cardiac monitor.  Risk Decision regarding hospitalization. Risk Details: Patient with nonspecific tingling pain left arm.  No active chest pain.  Cardiac monitor is negative.  Doubt ACS, PE, pneumonia or other intrathoracic disorders.  No evidence for cervical myelopathy.  Symptoms are mild and tended to improve without intervention.  No indication for hospitalization at this time.  Instructed her to follow-up with her PCP or cardiologist as needed for further problems and is scheduled.           Final Clinical Impression(s) / ED Diagnoses Final diagnoses:  Paresthesia and pain of left extremity    Rx / DC Orders ED Discharge Orders     None         Mancel Bale, MD 10/22/21 1426

## 2021-10-21 NOTE — Discharge Instructions (Signed)
Testing today did not show any serious problems.  Continue to follow-up with Dr. Eldridge Dace, as scheduled for further care and treatment.  Return here if needed

## 2021-10-21 NOTE — ED Provider Triage Note (Signed)
Emergency Medicine Provider Triage Evaluation Note  Anne Landry , a 56 y.o. female  was evaluated in triage.  Pt complains of left arm tingling x today. Prior intervention due to [facility], & NSTEMI by Dr. Vida Roller. Here with clamminess along with tingling similar to prior episode of NSTEMI. Took 2 baby ASA, helped some. No chest pain today  Review of Systems  Positive: Tingling left arm, clammy, anxious Negative: Chest pain, shortness of breath  Physical Exam  BP (!) 154/81   Pulse 74   Temp 98.4 F (36.9 C) (Oral)   Resp 14   LMP 06/24/2015   SpO2 100%  Gen:   Awake, no distress   Resp:  Normal effort  MSK:   Moves extremities without difficulty  Other:  Sensation intact throughout upper extremities, no facial asymmetry.   Medical Decision Making  Medically screening exam initiated at 12:59 PM.  Appropriate orders placed.  TYASHIA MORRISETTE was informed that the remainder of the evaluation will be completed by another provider, this initial triage assessment does not replace that evaluation, and the importance of remaining in the ED until their evaluation is complete.     Claude Manges, PA-C 10/21/21 1303

## 2021-10-21 NOTE — ED Triage Notes (Signed)
Patient complains of left arm tingling since am. Just had recent admission for SCAD and making sure ok. Alert and oriented, denies chest pain

## 2021-10-25 ENCOUNTER — Other Ambulatory Visit: Payer: Self-pay | Admitting: Nurse Practitioner

## 2021-10-25 DIAGNOSIS — I773 Arterial fibromuscular dysplasia: Secondary | ICD-10-CM

## 2021-11-08 ENCOUNTER — Ambulatory Visit (HOSPITAL_COMMUNITY)
Admission: RE | Admit: 2021-11-08 | Discharge: 2021-11-08 | Disposition: A | Discharge status: Home / Self Care | Payer: 59 | Source: Ambulatory Visit | Attending: Cardiology | Admitting: Cardiology

## 2021-11-08 ENCOUNTER — Ambulatory Visit (HOSPITAL_BASED_OUTPATIENT_CLINIC_OR_DEPARTMENT_OTHER)
Admission: RE | Admit: 2021-11-08 | Discharge: 2021-11-08 | Disposition: A | Discharge status: Home / Self Care | Payer: 59 | Source: Ambulatory Visit | Attending: Nurse Practitioner | Admitting: Nurse Practitioner

## 2021-11-08 DIAGNOSIS — I773 Arterial fibromuscular dysplasia: Secondary | ICD-10-CM

## 2021-11-20 ENCOUNTER — Other Ambulatory Visit: Payer: 59

## 2021-11-20 DIAGNOSIS — I2542 Coronary artery dissection: Secondary | ICD-10-CM

## 2021-11-20 DIAGNOSIS — E785 Hyperlipidemia, unspecified: Secondary | ICD-10-CM

## 2021-11-20 DIAGNOSIS — I214 Non-ST elevation (NSTEMI) myocardial infarction: Secondary | ICD-10-CM

## 2021-11-20 LAB — HEPATIC FUNCTION PANEL
ALT: 26 IU/L (ref 0–32)
AST: 29 IU/L (ref 0–40)
Albumin: 4.6 g/dL (ref 3.8–4.9)
Alkaline Phosphatase: 56 IU/L (ref 44–121)
Bilirubin Total: 0.5 mg/dL (ref 0.0–1.2)
Bilirubin, Direct: 0.15 mg/dL (ref 0.00–0.40)
Total Protein: 7.3 g/dL (ref 6.0–8.5)

## 2021-11-20 LAB — LIPID PANEL
Chol/HDL Ratio: 2.4 ratio (ref 0.0–4.4)
Cholesterol, Total: 178 mg/dL (ref 100–199)
HDL: 73 mg/dL (ref >39)
LDL Chol Calc (NIH): 90 mg/dL (ref 0–99)
Triglycerides: 79 mg/dL (ref 0–149)
VLDL Cholesterol Cal: 15 mg/dL (ref 5–40)

## 2021-11-20 NOTE — Progress Notes (Signed)
56 y.o. [id number] Married White or Caucasian Not Hispanic or Latino female here for annual exam.  No vaginal bleeding. No bowel or bladder c/o.   She had a mild MI in 5/23, she has been started on Crestor and ASA.  Cardiac CT score was 0. 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     Patient's last menstrual period was 06/24/2015.          Sexually active: Yes.    The current method of family planning is post menopausal status.    Exercising: Yes.     Walking and Tennis  Smoker:  no  Health Maintenance: Pap:   09/12/16 negative, HR HPV negative  History of abnormal Pap:  no MMG:  04/24/21 Density C Bi-rads 1 neg  BMD:   never  Colonoscopy: 09/2014 Dr. Loreta Ave, repeat 10 years  TDaP:  07/13/14  Gardasil: n/a   reports that she has never smoked. She has never used smokeless tobacco. She reports current alcohol use. She reports that she does not use drugs. Just occasional ETOH. 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  Past Medical History:  Diagnosis Date   Hyperlipidemia    Insomnia    klonapin   Migraine without aura    NSTEMI (non-ST elevated myocardial infarction) (HCC)    Spontaneous dissection of coronary artery     No past surgical history on file.  Current Outpatient Medications  Medication Sig Dispense Refill   aspirin EC 81 MG tablet Take 1 tablet (81 mg total) by mouth daily. Swallow whole. 30 tablet 12   Magnesium 100 MG TABS Take 100 mg by mouth daily.     melatonin 3 MG TABS tablet Take 3 mg by mouth at bedtime.     Multiple Vitamins-Minerals (MULTIVITAMIN PO) Take 1 tablet by mouth daily.     Omega-3 1000 MG CAPS Take 1,000 mg by mouth daily.     Propylene Glycol (SYSTANE COMPLETE OP) Place 1 drop into both eyes daily as needed (dryness).     rosuvastatin (CRESTOR) 20 MG tablet Take 1 tablet (20 mg total) by mouth daily. 30 tablet 2   tretinoin (RETIN-A) 0.025 % cream Apply 1 application. topically daily as needed (skin spots).  2    VITAMIN D PO Take 1 tablet by mouth daily.     No current facility-administered medications for this visit.    Family History  Problem Relation Age of Onset   Hypertension Mother    Crohn's disease Father    Ovarian cancer Maternal Aunt     Review of Systems  All other systems reviewed and are negative.   Exam:   BP 110/74   Pulse 88   Ht 5\' 6"  (1.676 m)   Wt 133 lb (60.3 kg)   LMP 06/24/2015   SpO2 100%   BMI 21.47 kg/m   Weight change: @WEIGHTCHANGE @ Height:   Height: 5\' 6"  (167.6 cm)  Ht Readings from Last 3 Encounters:  11/27/21 5\' 6"  (1.676 m)  10/12/21 5\' 6"  (1.676 m)  10/03/21 5\' 7"  (1.702 m)    General appearance: alert, cooperative and appears stated age Head: Normocephalic, without obvious abnormality, atraumatic Neck: no adenopathy, supple, symmetrical, trachea midline and thyroid normal to inspection and palpation Lungs: clear to auscultation bilaterally Cardiovascular: regular rate and rhythm Breasts: normal appearance, no masses or tenderness Abdomen: soft, non-tender; non distended,  no masses,  no organomegaly Extremities: extremities normal, atraumatic, no cyanosis or edema Skin:  Skin color, texture, turgor normal. No rashes or lesions Lymph nodes: Cervical, supraclavicular, and axillary nodes normal. No abnormal inguinal nodes palpated Neurologic: Grossly normal   Pelvic: External genitalia:  no lesions              Urethra:  normal appearing urethra with no masses, tenderness or lesions              Bartholins and Skenes: normal                 Vagina: atrophic appearing vagina with normal color and discharge, no lesions              Cervix: no lesions               Bimanual Exam:  Uterus:  normal size, contour, position, consistency, mobility, non-tender              Adnexa: no mass, fullness, tenderness               Rectovaginal: Confirms               Anus:  normal sphincter tone, no lesions  [name] chaperoned for the exam.  1.  Well woman exam Discussed breast self exam Discussed calcium and vit D intake Mammogram and colonoscopy are UTD Labs are UTD  2. Screening for cervical cancer - Cytology - PAP  3. History of gestational diabetes Recent normal glucose

## 2021-11-21 ENCOUNTER — Telehealth: Payer: Self-pay

## 2021-11-21 DIAGNOSIS — E785 Hyperlipidemia, unspecified: Secondary | ICD-10-CM

## 2021-11-21 NOTE — Telephone Encounter (Signed)
The patient has been notified of the result and verbalized understanding.  All questions (if any) were answered. Theresia Majors, RN 11/21/2021 1:28 PM   Spoke with the patient who is hesitant about increasing her Crestor at this time. She states that she would like to continue to work on lifestyle modifications and repeat labs after that. If LDL is still elevated at that time she will be willing to increase Crestor. Patient has been scheduled for labs. Will let Robin Searing, NP know.

## 2021-11-21 NOTE — Telephone Encounter (Signed)
-----   Message from Gaston Islam., NP sent at 11/21/2021 10:38 AM EDT ----- Liver function is normal however your LDL cholesterol is still above goal of less than 70. Increase Crestor to 40 mg and repeat LFT's and lipids in 6 weeks. Please continue your current lifestyle modifications for risk reduction such as exercise and eating a low fat heart healthy diet.   Robin Searing, NP

## 2021-11-22 ENCOUNTER — Ambulatory Visit: Payer: 59 | Admitting: Obstetrics and Gynecology

## 2021-11-27 ENCOUNTER — Encounter: Payer: Self-pay | Admitting: Obstetrics and Gynecology

## 2021-11-27 ENCOUNTER — Ambulatory Visit (INDEPENDENT_AMBULATORY_CARE_PROVIDER_SITE_OTHER): Payer: 59 | Admitting: Obstetrics and Gynecology

## 2021-11-27 ENCOUNTER — Other Ambulatory Visit (HOSPITAL_COMMUNITY)
Admission: RE | Admit: 2021-11-27 | Discharge: 2021-11-27 | Disposition: A | Discharge status: Home / Self Care | Payer: 59 | Source: Ambulatory Visit | Attending: Obstetrics and Gynecology | Admitting: Obstetrics and Gynecology

## 2021-11-27 VITALS — BP 110/74 | HR 88 | Ht 66.0 in | Wt 133.0 lb

## 2021-11-27 DIAGNOSIS — Z124 Encounter for screening for malignant neoplasm of cervix: Secondary | ICD-10-CM | POA: Diagnosis present

## 2021-11-27 DIAGNOSIS — G43109 Migraine with aura, not intractable, without status migrainosus: Secondary | ICD-10-CM | POA: Insufficient documentation

## 2021-11-27 DIAGNOSIS — G479 Sleep disorder, unspecified: Secondary | ICD-10-CM | POA: Insufficient documentation

## 2021-11-27 DIAGNOSIS — I214 Non-ST elevation (NSTEMI) myocardial infarction: Secondary | ICD-10-CM | POA: Insufficient documentation

## 2021-11-27 DIAGNOSIS — H8110 Benign paroxysmal vertigo, unspecified ear: Secondary | ICD-10-CM | POA: Insufficient documentation

## 2021-11-27 DIAGNOSIS — N200 Calculus of kidney: Secondary | ICD-10-CM | POA: Insufficient documentation

## 2021-11-27 DIAGNOSIS — Z01419 Encounter for gynecological examination (general) (routine) without abnormal findings: Secondary | ICD-10-CM | POA: Diagnosis not present

## 2021-11-27 DIAGNOSIS — Z8632 Personal history of gestational diabetes: Secondary | ICD-10-CM | POA: Diagnosis not present

## 2021-11-27 DIAGNOSIS — N939 Abnormal uterine and vaginal bleeding, unspecified: Secondary | ICD-10-CM | POA: Insufficient documentation

## 2021-11-27 NOTE — Patient Instructions (Signed)

## 2021-11-29 LAB — CYTOLOGY - PAP
Comment: NEGATIVE
Diagnosis: NEGATIVE
High risk HPV: NEGATIVE

## 2021-12-20 ENCOUNTER — Other Ambulatory Visit: Payer: Self-pay | Admitting: Family Medicine

## 2021-12-20 DIAGNOSIS — E2839 Other primary ovarian failure: Secondary | ICD-10-CM

## 2022-01-01 ENCOUNTER — Other Ambulatory Visit: Payer: Self-pay | Admitting: Family Medicine

## 2022-01-01 DIAGNOSIS — E2839 Other primary ovarian failure: Secondary | ICD-10-CM

## 2022-01-02 ENCOUNTER — Other Ambulatory Visit: Payer: 59

## 2022-01-02 DIAGNOSIS — E785 Hyperlipidemia, unspecified: Secondary | ICD-10-CM

## 2022-01-02 LAB — LIPID PANEL
Chol/HDL Ratio: 2.1 ratio (ref 0.0–4.4)
Cholesterol, Total: 151 mg/dL (ref 100–199)
HDL: 71 mg/dL (ref >39)
LDL Chol Calc (NIH): 69 mg/dL (ref 0–99)
Triglycerides: 52 mg/dL (ref 0–149)
VLDL Cholesterol Cal: 11 mg/dL (ref 5–40)

## 2022-01-02 LAB — ALT: ALT: 22 IU/L (ref 0–32)

## 2022-01-08 ENCOUNTER — Other Ambulatory Visit: Payer: Self-pay

## 2022-01-08 MED ORDER — ROSUVASTATIN CALCIUM 20 MG PO TABS: 20.0000 mg | ORAL_TABLET | Freq: Every day | ORAL | 3 refills | Status: DC

## 2022-01-08 NOTE — Telephone Encounter (Signed)
Pt's medication was sent to pt's pharmacy as requested. Confirmation received.  °

## 2022-01-22 NOTE — Progress Notes (Signed)
Cardiology Office Note   Date:  01/25/2022   ID:  Anne Landry, DOB 1965/10/29, MRN [id number]  PCP:  Gweneth Dimitri, MD    No chief complaint on file.  Old MI  Wt Readings from Last 3 Encounters:  01/25/22 137 lb (62.1 kg)  11/27/21 133 lb (60.3 kg)  10/12/21 134 lb (60.8 kg)       History of Present Illness: Anne Landry is a 56 y.o. female  with history of HLD by labs (LDL 162 in 11/2020) otherwise no significant PMH prior to 5/23 admission for suspected SCAD.  Presented to the hospital with CP that began while driving. Troponins were elevated to peak 480. CXR nonacute. She underwent cardiac CT which was negative for evidence of dissection in the major vessels. 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 It was felt the septal branch may be too small to be seen on CT. It was recommended to treat with ASA and statin. She was not started on BB due to baseline bradycardia. Echo showed EF 55-60%, mild LVH, g1DD, normal RV.  No FMD by carotid or renal artery duplex.     Past Medical History:  Diagnosis Date   Hyperlipidemia    Insomnia    klonapin   Migraine without aura    NSTEMI (non-ST elevated myocardial infarction) (HCC)    Spontaneous dissection of coronary artery     No past surgical history on file.   Current Outpatient Medications  Medication Sig Dispense Refill   aspirin EC 81 MG tablet Take 1 tablet (81 mg total) by mouth daily. Swallow whole. 30 tablet 12   Magnesium 100 MG TABS Take 100 mg by mouth daily.     melatonin 3 MG TABS tablet Take 3 mg by mouth at bedtime.     Multiple Vitamins-Minerals (MULTIVITAMIN PO) Take 1 tablet by mouth daily.     Omega-3 1000 MG CAPS Take 1,000 mg by mouth daily.     Propylene Glycol (SYSTANE COMPLETE OP) Place 1 drop into both eyes daily as needed (dryness).     rosuvastatin (CRESTOR) 20 MG tablet Take 1 tablet (20 mg total) by mouth daily. 90 tablet 3    tretinoin (RETIN-A) 0.025 % cream Apply 1 application. topically daily as needed (skin spots).  2   VITAMIN D PO Take 1 tablet by mouth daily.     No current facility-administered medications for this visit.    Allergies:   Patient has no known allergies.    Social History:  The patient  reports that she has never smoked. She has never used smokeless tobacco. She reports current alcohol use. She reports that she does not use drugs.   Family History:  The patient's family history includes Crohn's disease in her father; Hypertension in her mother; Ovarian cancer in her maternal aunt.    ROS:  Please see the history of present illness.   Otherwise, review of systems are positive for paying more attention to her chest pain.   All other systems are reviewed and negative.    PHYSICAL EXAM: VS:  BP 90/60 (BP Location: Left Arm, Patient Position: Sitting, Cuff Size: Normal)   Pulse 70   Ht 5\' 7"  (1.702 m)   Wt 137 lb (62.1 kg)   LMP 06/24/2015   SpO2 98%   BMI 21.46 kg/m  , BMI Body mass index is 21.46 kg/m. GEN: Well nourished, well developed, in no acute distress HEENT:  normal Neck: no JVD, carotid bruits, or masses Cardiac: RRR; no murmurs, rubs, or gallops,no edema  Respiratory:  clear to auscultation bilaterally, normal work of breathing GI: soft, nontender, nondistended, + BS MS: no deformity or atrophy Skin: warm and dry, no rash Neuro:  Strength and sensation are intact Psych: euthymic mood, full affect   EKG:   The ekg ordered 6/23 demonstrates NSR, no ST changes   Recent Labs: 10/21/2021: BUN 18; Creatinine, Ser 0.85; Hemoglobin 13.8; Platelets 218; Potassium 3.9; Sodium 141 01/02/2022: ALT 22   Lipid Panel    Component Value Date/Time   CHOL 151 01/02/2022 0906   TRIG 52 01/02/2022 0906   HDL 71 01/02/2022 0906   CHOLHDL 2.1 01/02/2022 0906   CHOLHDL 3.6 11/17/2020 1018   VLDL 20 09/12/2016 1631   LDLCALC 69 01/02/2022 0906   LDLCALC 162 (H) 11/17/2020 1018      Other studies Reviewed: Additional studies/ records that were reviewed today with results demonstrating: labs reviewed.   ASSESSMENT AND PLAN:  SCAD: No FMD noted.  No recurrent angina.  Continue healthy lifestyle.  She remains active.  Calcium score was 0 on her CTA. Hyperlipidemia: Noted prior to MI.  Tolerating rosuvastatin 20 mg daily, will continue the dose.  Cholesterol readings are well controlled from January 02, 2022.  Whole food, plant based diet.  High-fiber foods. Avoid Processed foods.  She admits to some dietary indiscretion but lipids are well controlled on the current dose of rosuvastatin.  We will continue. Old MI: No CHF.   Current medicines are reviewed at length with the patient today.  The patient concerns regarding her medicines were addressed.  The following changes have been made:  No change  Labs/ tests ordered today include:  No orders of the defined types were placed in this encounter.   Recommend 150 minutes/week of aerobic exercise Low fat, low carb, high fiber diet recommended  Disposition:   FU in 1 year   Signed, Lance Muss, MD  01/25/2022 9:14 AM    [facility] [address] [place], [place], [place]  [address] Phone: [phone]; Fax: [phone]

## 2022-01-23 ENCOUNTER — Ambulatory Visit
Admission: RE | Admit: 2022-01-23 | Discharge: 2022-01-23 | Disposition: A | Discharge status: Home / Self Care | Payer: 59 | Source: Ambulatory Visit | Attending: Family Medicine | Admitting: Family Medicine

## 2022-01-23 DIAGNOSIS — E2839 Other primary ovarian failure: Secondary | ICD-10-CM

## 2022-01-25 ENCOUNTER — Ambulatory Visit: Payer: 59 | Attending: Interventional Cardiology | Admitting: Interventional Cardiology

## 2022-01-25 ENCOUNTER — Encounter: Payer: Self-pay | Admitting: Interventional Cardiology

## 2022-01-25 VITALS — BP 90/60 | HR 70 | Ht 67.0 in | Wt 137.0 lb

## 2022-01-25 DIAGNOSIS — I252 Old myocardial infarction: Secondary | ICD-10-CM

## 2022-01-25 DIAGNOSIS — E785 Hyperlipidemia, unspecified: Secondary | ICD-10-CM | POA: Diagnosis not present

## 2022-01-25 DIAGNOSIS — I2542 Coronary artery dissection: Secondary | ICD-10-CM | POA: Diagnosis not present

## 2022-01-25 NOTE — Patient Instructions (Signed)
Medication Instructions:  Your physician recommends that you continue on your current medications as directed. Please refer to the Current Medication list given to you today.  *If you need a refill on your cardiac medications before your next appointment, please call your pharmacy*   Lab Work: none If you have labs (blood work) drawn today and your tests are completely normal, you will receive your results only by: MyChart Message (if you have MyChart) OR A paper copy in the mail If you have any lab test that is abnormal or we need to change your treatment, we will call you to review the results.   Testing/Procedures: none   Follow-Up: At [facility], you and your health needs are our priority.  As part of our continuing mission to provide you with exceptional heart care, we have created designated Provider Care Teams.  These Care Teams include your primary Cardiologist (physician) and Advanced Practice Providers (APPs -  Physician Assistants and Nurse Practitioners) who all work together to provide you with the care you need, when you need it.  We recommend signing up for the patient portal called "MyChart".  Sign up information is provided on this After Visit Summary.  MyChart is used to connect with patients for Virtual Visits (Telemedicine).  Patients are able to view lab/test results, encounter notes, upcoming appointments, etc.  Non-urgent messages can be sent to your provider as well.   To learn more about what you can do with MyChart, go to [email or web address].    Your next appointment:   12 month(s)  The format for your next appointment:   In Person  Provider:   Jayadeep Varanasi, MD     Other Instructions    Important Information About Sugar       

## 2022-04-03 ENCOUNTER — Other Ambulatory Visit: Payer: Self-pay | Admitting: Family Medicine

## 2022-04-03 DIAGNOSIS — Z1231 Encounter for screening mammogram for malignant neoplasm of breast: Secondary | ICD-10-CM

## 2022-05-31 ENCOUNTER — Ambulatory Visit: Payer: 59

## 2022-06-05 ENCOUNTER — Ambulatory Visit
Admission: RE | Admit: 2022-06-05 | Discharge: 2022-06-05 | Disposition: A | Discharge status: Home / Self Care | Payer: 59 | Source: Ambulatory Visit | Attending: Family Medicine | Admitting: Family Medicine

## 2022-06-05 DIAGNOSIS — Z1231 Encounter for screening mammogram for malignant neoplasm of breast: Secondary | ICD-10-CM

## 2022-11-29 ENCOUNTER — Ambulatory Visit: Payer: 59 | Admitting: Obstetrics and Gynecology

## 2022-11-29 ENCOUNTER — Ambulatory Visit: Payer: 59 | Admitting: Radiology

## 2023-01-07 ENCOUNTER — Other Ambulatory Visit: Payer: Self-pay | Admitting: Interventional Cardiology

## 2023-01-17 ENCOUNTER — Encounter: Payer: Self-pay | Admitting: Radiology

## 2023-01-17 ENCOUNTER — Ambulatory Visit (INDEPENDENT_AMBULATORY_CARE_PROVIDER_SITE_OTHER): Payer: 59 | Admitting: Radiology

## 2023-01-17 VITALS — BP 118/80 | Ht 65.75 in | Wt 139.0 lb

## 2023-01-17 DIAGNOSIS — Z01419 Encounter for gynecological examination (general) (routine) without abnormal findings: Secondary | ICD-10-CM | POA: Diagnosis not present

## 2023-01-17 NOTE — Progress Notes (Signed)
   Anne Landry [birth year] [id number]   History: Postmenopausal 57 y.o. presents for annual exam. Doing well, no gyn concerns.    Gynecologic History Postmenopausal Last Pap: 2023. Results were: normal Last mammogram: 05/2022. Results were: normal Last colonoscopy: 2015   Obstetric History OB History  Gravida Para Term Preterm AB Living  4 2 2   2 2   SAB IAB Ectopic Multiple Live Births  2       2    # Outcome Date GA Lbr Len/2nd Weight Sex Type Anes PTL Lv  4 Term 10/1998 [redacted]w[redacted]d  7 lb 5 oz (3.317 kg) F Vag-Spont   LIV  3 Term 10/1994 [redacted]w[redacted]d  7 lb 11 oz (3.487 kg) F Vag-Spont   LIV  2 SAB           1 SAB              The following portions of the patient's history were reviewed and updated as appropriate: allergies, current medications, past family history, past medical history, past social history, past surgical history, and problem list.  Review of Systems Pertinent items noted in HPI and remainder of comprehensive ROS otherwise negative.  Past medical history, past surgical history, family history and social history were all reviewed and documented in the EPIC chart.  Exam:  Vitals:   01/17/23 1324  BP: 118/80  Weight: 139 lb (63 kg)  Height: 5' 5.75" (1.67 m)   Body mass index is 22.61 kg/m.  General appearance:  Normal Thyroid:  Symmetrical, normal in size, without palpable masses or nodularity. Respiratory  Auscultation:  Clear without wheezing or rhonchi Cardiovascular  Auscultation:  Regular rate, without rubs, murmurs or gallops  Edema/varicosities:  Not grossly evident Abdominal  Soft,nontender, without masses, guarding or rebound.  Liver/spleen:  No organomegaly noted  Hernia:  None appreciated  Skin  Inspection:  Grossly normal Breasts: Examined lying and sitting.   Right: Without masses, retractions, nipple discharge or axillary adenopathy.   Left: Without masses, retractions, nipple discharge or axillary adenopathy. Genitourinary    Inguinal/mons:  Normal without inguinal adenopathy  External genitalia:  Normal appearing vulva with no masses, tenderness, or lesions  BUS/Urethra/Skene's glands:  Normal  Vagina:  Normal appearing with normal color and discharge, no lesions. Atrophy: mild   Cervix:  Normal appearing without discharge or lesions  Uterus:  Normal in size, shape and contour.  Midline and mobile, nontender  Adnexa/parametria:     Rt: Normal in size, without masses or tenderness.   Lt: Normal in size, without masses or tenderness.  Anus and perineum: Normal    Anne Landry, CMA present for exam  Assessment/Plan:   1. Well woman exam with routine gynecological exam Pp 2026 Up to date with screenings Followed by PCP    Discussed SBE, colonoscopy and DEXA screening as directed. Recommend of exercise weekly, including weight bearing exercise. Encouraged the use of seatbelts and sunscreen.  Return in 1 year for annual or sooner prn.  Anne Landry B WHNP-BC, 1:42 PM 01/17/2023

## 2023-02-13 ENCOUNTER — Ambulatory Visit: Payer: 59 | Admitting: Interventional Cardiology

## 2023-04-06 ENCOUNTER — Other Ambulatory Visit: Payer: Self-pay | Admitting: Interventional Cardiology

## 2023-05-27 ENCOUNTER — Ambulatory Visit: Payer: 59 | Attending: Interventional Cardiology | Admitting: Cardiology

## 2023-05-27 VITALS — BP 110/58 | HR 68 | Ht 66.0 in | Wt 143.0 lb

## 2023-05-27 DIAGNOSIS — I214 Non-ST elevation (NSTEMI) myocardial infarction: Secondary | ICD-10-CM | POA: Diagnosis not present

## 2023-05-27 DIAGNOSIS — I2542 Coronary artery dissection: Secondary | ICD-10-CM | POA: Diagnosis not present

## 2023-05-27 DIAGNOSIS — I252 Old myocardial infarction: Secondary | ICD-10-CM

## 2023-05-27 DIAGNOSIS — E785 Hyperlipidemia, unspecified: Secondary | ICD-10-CM | POA: Diagnosis not present

## 2023-05-27 NOTE — Patient Instructions (Signed)
Medication Instructions:  The current medical regimen is effective;  continue present plan and medications.  *If you need a refill on your cardiac medications before your next appointment, please call your pharmacy*   Follow-Up: At [facility], you and your health needs are our priority.  As part of our continuing mission to provide you with exceptional heart care, we have created designated Provider Care Teams.  These Care Teams include your primary Cardiologist (physician) and Advanced Practice Providers (APPs -  Physician Assistants and Nurse Practitioners) who all work together to provide you with the care you need, when you need it.  We recommend signing up for the patient portal called "MyChart".  Sign up information is provided on this After Visit Summary.  MyChart is used to connect with patients for Virtual Visits (Telemedicine).  Patients are able to view lab/test results, encounter notes, upcoming appointments, etc.  Non-urgent messages can be sent to your provider as well.   To learn more about what you can do with MyChart, go to [email or web address].    Your next appointment:   2 year(s)  Provider:   Dr Candee Furbish

## 2023-05-27 NOTE — Progress Notes (Signed)
 Cardiology Office Note:  .   Date:  05/27/2023  ID:  Anne Landry, DOB 1965-05-18, MRN [id number] PCP: Aisha Harvey, MD  [facility] Providers Cardiologist:  Oneil Parchment, MD     History of Present Illness: .   Anne Landry is a 58 y.o. female Discussed with the use of AI scribe  History of Present Illness   The patient is a 58 year old individual with a history of a suspected spontaneous coronary artery dissection in May 2023. The patient presented with chest pain that began while driving, with troponin levels elevated to 480. A cardiac CT and MRI were performed, revealing a defect in the distribution of the septal perforator. The patient was treated with aspirin  and a statin, but not a beta blocker due to baseline bradycardia. An echocardiogram showed an ejection fraction of 55-60% with mild LVH and grade one diastolic dysfunction with normal RV. The patient has no history of fibromuscular dysplasia as per carotid or renal artery duplex.  The patient reported an episode of chest pain that occurred while driving to a tennis game. The patient described the pain as a feeling of pressure located in the middle of the chest, associated with anxiety, numbness, and tingling. The patient had taken meloxicam before the onset of the chest pain. The patient's troponin levels were found to be elevated, and a cardiac CT and MRI were performed. The patient was treated with aspirin  and a statin, and has been asymptomatic since the event.  The patient also reported a recent twinge in the chest, located on the left side, which occurred during exercise. The patient was unsure if this was related to the heart or a musculoskeletal issue. The patient has been maintaining a regular exercise routine, including playing tennis. The patient's LDL is 95 and triglycerides are 161. The patient has never smoked and has a family history of Crohn's disease in the father and hypertension in the mother. The  patient's calcium  score was zero on CT.          Studies Reviewed: SABRA   EKG Interpretation Date/Time:  Monday May 27 2023 16:10:02 EST Ventricular Rate:  68 PR Interval:  138 QRS Duration:  98 QT Interval:  432 QTC Calculation: 459 R Axis:   68  Text Interpretation: Normal sinus rhythm Incomplete right bundle branch block When compared with ECG of 21-Oct-2021 13:36, No significant change was found Confirmed by Parchment Oneil ([id number]) on 05/27/2023 4:32:08 PM    Results   LABS Troponin: 480 (09/2021) LDL: 95 Triglycerides: 161  RADIOLOGY Chest x-ray: Normal (09/2021) Cardiac CT: Negative for dissection in major vessels (09/2021) Cardiac MRI: Defect in distribution of septal perforator (09/2021) Calcium  score: 0  DIAGNOSTIC Echocardiogram: Ejection fraction 55-60%, mild LVH, grade 1 diastolic dysfunction, normal RV (09/2021) EKG: Normal (05/2023)     Risk Assessment/Calculations:            Physical Exam:   VS:  BP (!) 110/58 (BP Location: Left Arm)   Pulse 68   Ht 5' 6 (1.676 m)   Wt 143 lb (64.9 kg)   LMP 06/24/2015   SpO2 97%   BMI 23.08 kg/m    Wt Readings from Last 3 Encounters:  05/27/23 143 lb (64.9 kg)  01/17/23 139 lb (63 kg)  01/25/22 137 lb (62.1 kg)    GEN: Well nourished, well developed in no acute distress NECK: No JVD; No carotid bruits CARDIAC: RRR, no murmurs, no rubs, no gallops RESPIRATORY:  Clear to auscultation  without rales, wheezing or rhonchi  ABDOMEN: Soft, non-tender, non-distended EXTREMITIES:  No edema; No deformity   ASSESSMENT AND PLAN: .    Assessment and Plan    Spontaneous Coronary Artery Dissection (SCAD)   Suspected SCAD in May 2023 with chest pain, elevated troponins (up to 480), and a defect in the septal perforator on cardiac MRI. Cardiac CT negative for major vessel dissection. Echocardiogram showed ejection fraction of 55-60% with mild LVH and grade one diastolic dysfunction. No fibromuscular dysplasia.  Well-managed on aspirin  and statin therapy. No recurrence of symptoms. Discussed safety and efficacy of Crestor  20 mg for preventing future events. Emphasized importance of continuing aspirin  and statin therapy to stabilize plaque and reduce cardiovascular risk. Discussed slight increased cardiovascular risk with Mobic but unlikely to cause heart attacks or dissections directly.   - Continue aspirin  81 mg daily   - Continue Crestor  (rosuvastatin ) 20 mg daily   - Follow-up in 2 years or as needed    Musculoskeletal Chest Pain   Intermittent chest twinge, likely musculoskeletal, possibly related to recent exercise. No concerning features on recent evaluation.   - Provide reassurance   - Monitor symptoms and follow-up if she worsens    General Health Maintenance   Emphasized importance of current medications and lifestyle modifications to reduce cardiovascular risk. Discussed safety and efficacy of current medications.   - Continue current medications   - Maintain healthy diet and regular exercise, Tennis  Follow-up   - Schedule follow-up in 2 years   - Return sooner if new symptoms or concerns arise.              Signed, Oneil Parchment, MD

## 2023-06-17 IMAGING — MR MR CARD MORPHOLOGY WO/W CM
45 of 48 series · 45 of 48 positions shown · IV contrast (Contrast agent)
Comparison: none

CLINICAL DATA: Clinical question of myocarditis
Study assumes HCT of 41 and BSA of 1.69 m2.

EXAM:
CARDIAC MRI
TECHNIQUE: The patient was scanned on a 1.5 Tesla GE magnet. A dedicated
cardiac coil was used. Functional imaging was done using Fiesta
sequences. [DATE], and 4 chamber views were done to assess for RWMA's.
Modified Dach rule using a short axis stack was used to
calculate an ejection fraction on a dedicated work station using
Circle software. The patient received 6 cc of Gadavist. After 10
minutes inversion recovery sequences were used to assess for
infiltration and scar tissue.
CONTRAST:  6 cc  of Gadavist

[Series 4: t2_haste_db_tra_bh · axial · 8.0mm · 1.41mm/px · 1 of 16 slices shown]
[im 1/16]
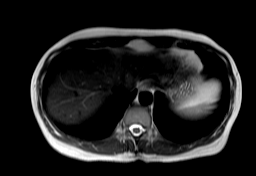

[Series 8: bSSFP · oblique · 8.0mm · 1.61mm/px · 1 of 25 slices shown (1 of 18)]
[im 1/25]
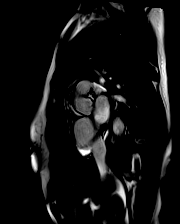

[Series 9: bSSFP · oblique · 8.0mm · 1.61mm/px · 1 of 25 slices shown (2 of 18)]
[im 1/25]
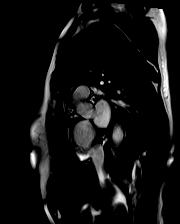

[Series 10: bSSFP · oblique · 8.0mm · 1.61mm/px · 1 of 25 slices shown (3 of 18)]
[im 1/25]
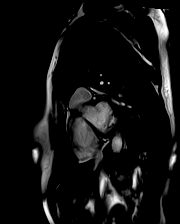

[Series 11: bSSFP · oblique · 8.0mm · 1.61mm/px · 1 of 25 slices shown (4 of 18)]
[im 1/25]
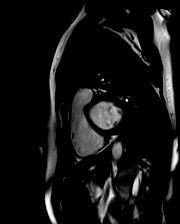

[Series 12: bSSFP · oblique · 8.0mm · 1.61mm/px · 1 of 25 slices shown (5 of 18)]
[im 1/25]
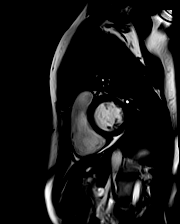

[Series 13: bSSFP · oblique · 8.0mm · 1.61mm/px · 1 of 25 slices shown (6 of 18)]
[im 1/25]
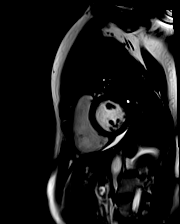

[Series 14: bSSFP · oblique · 8.0mm · 1.61mm/px · 1 of 25 slices shown (7 of 18)]
[im 1/25]
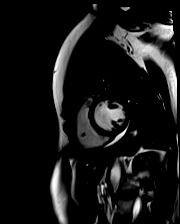

[Series 15: bSSFP · oblique · 8.0mm · 1.61mm/px · 1 of 25 slices shown (8 of 18)]
[im 1/25]
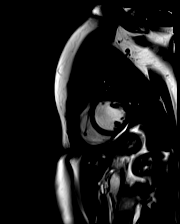

[Series 16: bSSFP · oblique · 8.0mm · 1.61mm/px · 1 of 25 slices shown (9 of 18)]
[im 1/25]
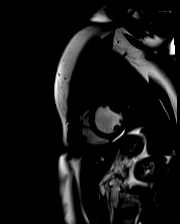

[Series 17: bSSFP · oblique · 8.0mm · 1.61mm/px · 1 of 25 slices shown (10 of 18)]
[im 1/25]
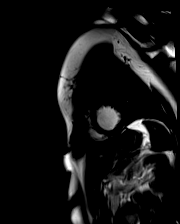

[Series 18: bSSFP · oblique · 8.0mm · 1.61mm/px · 1 of 25 slices shown (11 of 18)]
[im 1/25]
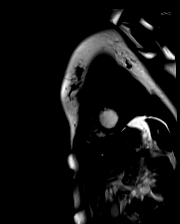

[Series 19: bSSFP · oblique · 8.0mm · 1.61mm/px · 1 of 25 slices shown (12 of 18)]
[im 1/25]
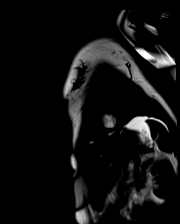

[Series 20: bSSFP · oblique · 8.0mm · 1.61mm/px · 1 of 25 slices shown (13 of 18)]
[im 1/25]
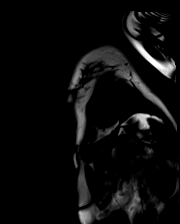

[Series 21: bSSFP · oblique · 8.0mm · 1.61mm/px · 1 of 25 slices shown (14 of 18)]
[im 1/25]
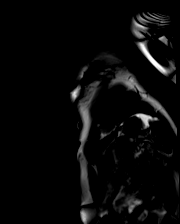

[Series 22: bSSFP · oblique · 8.0mm · 1.61mm/px · 1 of 25 slices shown (15 of 18)]
[im 1/25]
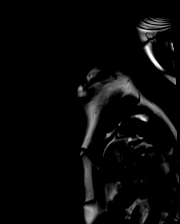

[Series 23: bSSFP · oblique · 6.0mm · 1.41mm/px · 1 of 25 slices shown (16 of 18)]
[im 1/25]
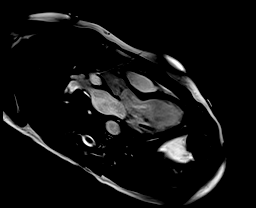

[Series 24: bSSFP · oblique · 6.0mm · 1.41mm/px · 1 of 25 slices shown (17 of 18)]
[im 1/25]
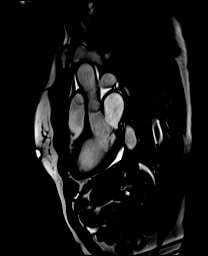

[Series 25: bSSFP · oblique · 6.0mm · 1.41mm/px · 1 of 25 slices shown (18 of 18)]
[im 1/25]
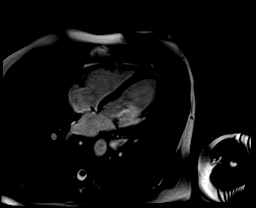

[Series 26: (id)_long_t1 · oblique · 8.0mm · 1.56mm/px · 1 of 24 slices shown]
[im 1/24]
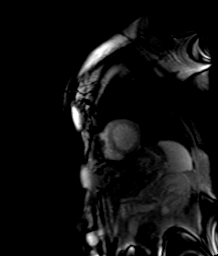

[Series 27: (id)_long_t1_moco · oblique · 8.0mm · 1.56mm/px · 1 of 24 slices shown]
[im 1/24]
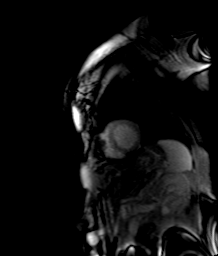

[Series 28: (id)_long_t1_moco_t1 · oblique · 8.0mm · 1.56mm/px · 1 of 3 slices shown]
[im 1/3]
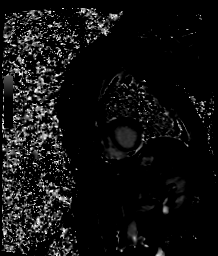

[Series 30: (id)_trufi · oblique · 8.0mm · 2.08mm/px · 1 of 9 slices shown]
[im 1/9]
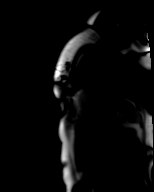

[Series 31: (id)_trufi_moco · oblique · 8.0mm · 2.08mm/px · 1 of 9 slices shown]
[im 1/9]
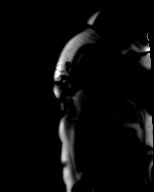

[Series 32: (id)_trufi_moco_t2 · oblique · 8.0mm · 2.08mm/px · 1 of 3 slices shown]
[im 1/3]
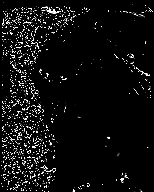

[Series 34: cine_trufi_short axis_cs_2_shot · oblique · 8.0mm · 1.48mm/px · 1 of 25 slices shown (1 of 15)]
[im 1/25]
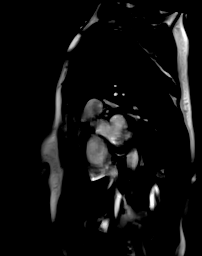

[Series 34: cine_trufi_short axis_cs_2_shot · oblique · 8.0mm · 1.48mm/px · 1 of 25 slices shown (2 of 15)]
[im 1/25]
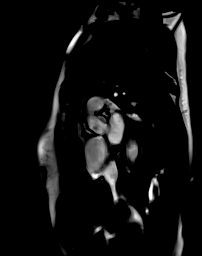

[Series 34: cine_trufi_short axis_cs_2_shot · oblique · 8.0mm · 1.48mm/px · 1 of 25 slices shown (3 of 15)]
[im 1/25]
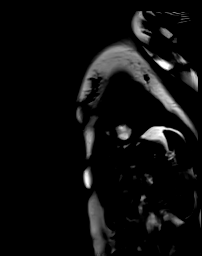

[Series 34: cine_trufi_short axis_cs_2_shot · oblique · 8.0mm · 1.48mm/px · 1 of 25 slices shown (4 of 15)]
[im 1/25]
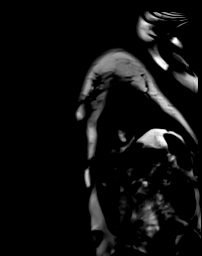

[Series 34: cine_trufi_short axis_cs_2_shot · oblique · 8.0mm · 1.48mm/px · 1 of 25 slices shown (5 of 15)]
[im 1/25]
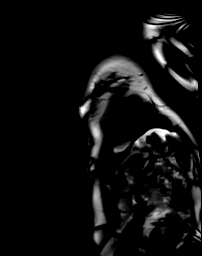

[Series 34: cine_trufi_short axis_cs_2_shot · oblique · 8.0mm · 1.48mm/px · 1 of 25 slices shown (6 of 15)]
[im 1/25]
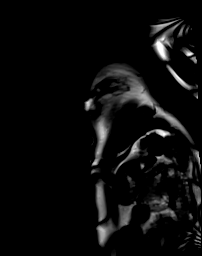

[Series 34: cine_trufi_short axis_cs_2_shot · oblique · 8.0mm · 1.48mm/px · 1 of 25 slices shown (7 of 15)]
[im 1/25]
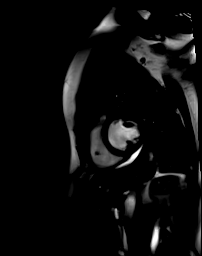

[Series 34: cine_trufi_short axis_cs_2_shot · oblique · 8.0mm · 1.48mm/px · 1 of 25 slices shown (8 of 15)]
[im 1/25]
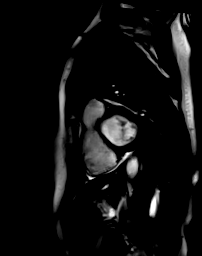

[Series 34: cine_trufi_short axis_cs_2_shot · oblique · 8.0mm · 1.48mm/px · 1 of 25 slices shown (9 of 15)]
[im 1/25]
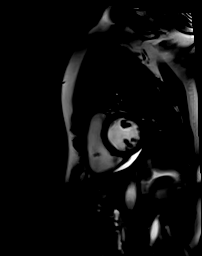

[Series 34: cine_trufi_short axis_cs_2_shot · oblique · 8.0mm · 1.48mm/px · 1 of 25 slices shown (10 of 15)]
[im 1/25]
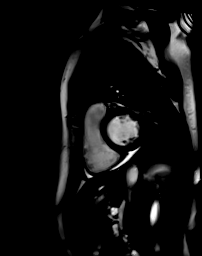

[Series 34: cine_trufi_short axis_cs_2_shot · oblique · 8.0mm · 1.48mm/px · 1 of 25 slices shown (11 of 15)]
[im 1/25]
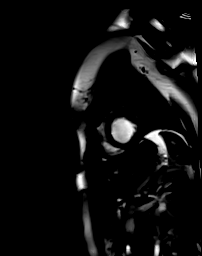

[Series 34: cine_trufi_short axis_cs_2_shot · oblique · 8.0mm · 1.48mm/px · 1 of 25 slices shown (12 of 15)]
[im 1/25]
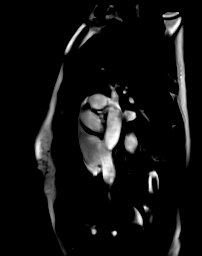

[Series 34: cine_trufi_short axis_cs_2_shot · oblique · 8.0mm · 1.48mm/px · 1 of 25 slices shown (13 of 15)]
[im 1/25]
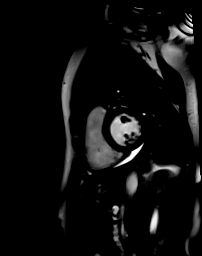

[Series 34: cine_trufi_short axis_cs_2_shot · oblique · 8.0mm · 1.48mm/px · 1 of 25 slices shown (14 of 15)]
[im 1/25]
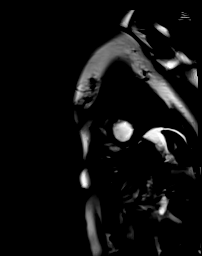

[Series 34: cine_trufi_short axis_cs_2_shot · oblique · 8.0mm · 1.48mm/px · 1 of 25 slices shown (15 of 15)]
[im 1/25]
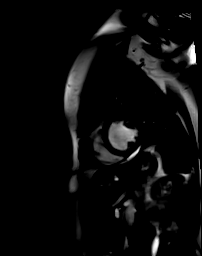

[Series 35: STIR · oblique · 8.0mm · 1.92mm/px · 1 of 14 slices shown]
[im 1/14]
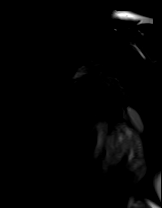

[Series 36: pre short axis · oblique · non-contrast · 8.0mm · 2.25mm/px · 1 of 10 slices shown (1 of 4)]
[im 1/10]
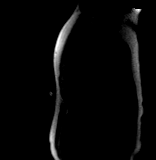

[Series 37: pre short axis · oblique · non-contrast · 8.0mm · 2.25mm/px · 1 of 10 slices shown (2 of 4)]
[im 1/10]
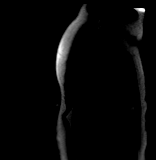

[Series 38: pre short axis · oblique · non-contrast · 8.0mm · 2.25mm/px · 1 of 10 slices shown (3 of 4)]
[im 1/10]
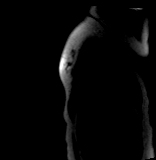

[Series 39: pre short axis · oblique · non-contrast · 8.0mm · 2.25mm/px · 1 of 10 slices shown (4 of 4)]
[im 1/10]
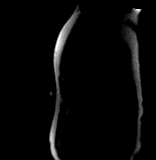

[45 of 48 positions shown; findings below may reference images not displayed]

FINDINGS: 1. Normal left ventricular size, with LVEDD 50 mm, and LVEDVi 73
mL/m2.

Normal left ventricular thickness, with intraventricular septal
thickness of 8 mm, posterior wall thickness of 7 mm, and myocardial
mass index of 46 g/m2.

Normal left ventricular systolic function (LVEF = 62%). There are no
regional wall motion abnormalities.

Left ventricular parametric mapping notable for normal T2 and ECV
signal.

There is late gadolinium enhancement in the left ventricular
myocardium: very small apical septal transmural LGE seen on short
axis stack and two chamber PSIR imaging.

2. Mild right ventricular dilation with RVEDVI 90 mL/m2, RVEDV 150
mL (ULN is 141 mL).

Normal right ventricular thickness.

Normal right ventricular systolic function (RVEF =52%). There are no
regional wall motion abnormalities or aneurysms.

3.  Normal left and right atrial size.

4. Normal size of the aortic root, ascending aorta and pulmonary
artery.

5. Valve assessment:

Aortic Valve: Tri-leaflet aortic valve. Qualitatively, there is no
significant regurgitation.

Pulmonic Valve: Qualitatively, there is no significant
regurgitation.

Tricuspid Valve: Qualitatively, there is no significant
regurgitation.

Mitral Valve: There is no significant regurgitation.

6.  Normal pericardium.  No pericardial effusion.

7. Grossly, no extracardiac findings. Recommended dedicated study if
concerned for non-cardiac pathology.
IMPRESSION: Modified [place] Criteria is not met for myocarditis.

LGE pattern could be consistent with small septal perforator
infarct.

Reviewed with primary team.

## 2023-07-20 ENCOUNTER — Other Ambulatory Visit: Payer: Self-pay | Admitting: Cardiology

## 2024-01-21 ENCOUNTER — Ambulatory Visit: Payer: 59 | Admitting: Radiology

## 2024-01-22 ENCOUNTER — Ambulatory Visit (INDEPENDENT_AMBULATORY_CARE_PROVIDER_SITE_OTHER): Admitting: Radiology

## 2024-01-22 ENCOUNTER — Encounter: Payer: Self-pay | Admitting: Radiology

## 2024-01-22 VITALS — BP 116/74 | HR 88 | Ht 66.75 in | Wt 139.0 lb

## 2024-01-22 DIAGNOSIS — Z1211 Encounter for screening for malignant neoplasm of colon: Secondary | ICD-10-CM

## 2024-01-22 DIAGNOSIS — Z01419 Encounter for gynecological examination (general) (routine) without abnormal findings: Secondary | ICD-10-CM

## 2024-01-22 DIAGNOSIS — Z1331 Encounter for screening for depression: Secondary | ICD-10-CM | POA: Diagnosis not present

## 2024-01-22 NOTE — Patient Instructions (Signed)
 Preventive Care 58-60 Years Old, Female  Preventive care refers to lifestyle choices and visits with your health care provider that can promote health and wellness. Preventive care visits are also called wellness exams.  What can I expect for my preventive care visit?  Counseling  Your health care provider may ask you questions about your:  Medical history, including:  Past medical problems.  Family medical history.  Pregnancy history.  Current health, including:  Menstrual cycle.  Method of birth control.  Emotional well-being.  Home life and relationship well-being.  Sexual activity and sexual health.  Lifestyle, including:  Alcohol, nicotine or tobacco, and drug use.  Access to firearms.  Diet, exercise, and sleep habits.  Work and work Astronomer.  Sunscreen use.  Safety issues such as seatbelt and bike helmet use.  Physical exam  Your health care provider will check your:  Height and weight. These may be used to calculate your BMI (body mass index). BMI is a measurement that tells if you are at a healthy weight.  Waist circumference. This measures the distance around your waistline. This measurement also tells if you are at a healthy weight and may help predict your risk of certain diseases, such as type 2 diabetes and high blood pressure.  Heart rate and blood pressure.  Body temperature.  Skin for abnormal spots.  What immunizations do I need?    Vaccines are usually given at various ages, according to a schedule. Your health care provider will recommend vaccines for you based on your age, medical history, and lifestyle or other factors, such as travel or where you work.  What tests do I need?  Screening  Your health care provider may recommend screening tests for certain conditions. This may include:  Lipid and cholesterol levels.  Diabetes screening. This is done by checking your blood sugar (glucose) after you have not eaten for a while (fasting).  Pelvic exam and Pap test.  Hepatitis B test.  Hepatitis C  test.  HIV (human immunodeficiency virus) test.  STI (sexually transmitted infection) testing, if you are at risk.  Lung cancer screening.  Colorectal cancer screening.  Mammogram. Talk with your health care provider about when you should start having regular mammograms. This may depend on whether you have a family history of breast cancer.  BRCA-related cancer screening. This may be done if you have a family history of breast, ovarian, tubal, or peritoneal cancers.  Bone density scan. This is done to screen for osteoporosis.  Talk with your health care provider about your test results, treatment options, and if necessary, the need for more tests.  Follow these instructions at home:  Eating and drinking    Eat a diet that includes fresh fruits and vegetables, whole grains, lean protein, and low-fat dairy products.  Take vitamin and mineral supplements as recommended by your health care provider.  Do not drink alcohol if:  Your health care provider tells you not to drink.  You are pregnant, may be pregnant, or are planning to become pregnant.  If you drink alcohol:  Limit how much you have to 0-1 drink a day.  Know how much alcohol is in your drink. In the [place] one drink equals one 12 oz bottle of beer (355 mL), one 5 oz glass of wine (148 mL), or one 1 oz glass of hard liquor (44 mL).  Lifestyle  Brush your teeth every morning and night with fluoride toothpaste. Floss one time each day.  Exercise for at least  30 minutes 5 or more days each week.  Do not use any products that contain nicotine or tobacco. These products include cigarettes, chewing tobacco, and vaping devices, such as e-cigarettes. If you need help quitting, ask your health care provider.  Do not use drugs.  If you are sexually active, practice safe sex. Use a condom or other form of protection to prevent STIs.  If you do not wish to become pregnant, use a form of birth control. If you plan to become pregnant, see your health care provider for a  prepregnancy visit.  Take aspirin only as told by your health care provider. Make sure that you understand how much to take and what form to take. Work with your health care provider to find out whether it is safe and beneficial for you to take aspirin daily.  Find healthy ways to manage stress, such as:  Meditation, yoga, or listening to music.  Journaling.  Talking to a trusted person.  Spending time with friends and family.  Minimize exposure to UV radiation to reduce your risk of skin cancer.  Safety  Always wear your seat belt while driving or riding in a vehicle.  Do not drive:  If you have been drinking alcohol. Do not ride with someone who has been drinking.  When you are tired or distracted.  While texting.  If you have been using any mind-altering substances or drugs.  Wear a helmet and other protective equipment during sports activities.  If you have firearms in your house, make sure you follow all gun safety procedures.  Seek help if you have been physically or sexually abused.  What's next?  Visit your health care provider once a year for an annual wellness visit.  Ask your health care provider how often you should have your eyes and teeth checked.  Stay up to date on all vaccines.  This information is not intended to replace advice given to you by your health care provider. Make sure you discuss any questions you have with your health care provider.  Document Revised: 10/26/2020 Document Reviewed: 10/26/2020  Elsevier Patient Education  2024 [employer].

## 2024-01-22 NOTE — Progress Notes (Signed)
   Anne Landry 02/10/1966 [id number]   History: Postmenopausal 58 y.o. presents for annual exam. Doing well, no gyn concerns.    Gynecologic History Postmenopausal Last Pap: 2023. Results were: normal Last mammogram: 06/05/2022. Results were: normal Last colonoscopy: 2015   Obstetric History OB History  Gravida Para Term Preterm AB Living  4 2 2  2 2   SAB IAB Ectopic Multiple Live Births  2    2    # Outcome Date GA Lbr Len/2nd Weight Sex Type Anes PTL Lv  4 Term 10/1998 [redacted]w[redacted]d  7 lb 5 oz (3.317 kg) F Vag-Spont   LIV  3 Term 10/1994 101w0d  7 lb 11 oz (3.487 kg) F Vag-Spont   LIV  2 SAB           1 SAB               01/22/2024    3:25 PM  Depression screen PHQ 2/9  Decreased Interest 0  Down, Depressed, Hopeless 0  PHQ - 2 Score 0     The following portions of the patient's history were reviewed and updated as appropriate: allergies, current medications, past family history, past medical history, past social history, past surgical history, and problem list.  Review of Systems Pertinent items noted in HPI and remainder of comprehensive ROS otherwise negative.  Past medical history, past surgical history, family history and social history were all reviewed and documented in the EPIC chart.  Exam:  Vitals:   01/22/24 1523  BP: 116/74  Pulse: 88  SpO2: 99%  Weight: 139 lb (63 kg)  Height: 5' 6.75 (1.695 m)   Body mass index is 21.93 kg/m.  General appearance:  Normal Thyroid:  Symmetrical, normal in size, without palpable masses or nodularity. Respiratory  Auscultation:  Clear without wheezing or rhonchi Cardiovascular  Auscultation:  Regular rate, without rubs, murmurs or gallops  Edema/varicosities:  Not grossly evident Abdominal  Soft,nontender, without masses, guarding or rebound.  Liver/spleen:  No organomegaly noted  Hernia:  None appreciated  Skin  Inspection:  Grossly normal Breasts: Examined lying and sitting.   Right: Without masses,  retractions, nipple discharge or axillary adenopathy.   Left: Without masses, retractions, nipple discharge or axillary adenopathy. Genitourinary   Inguinal/mons:  Normal without inguinal adenopathy  External genitalia:  Normal appearing vulva with no masses, tenderness, or lesions  BUS/Urethra/Skene's glands:  Normal  Vagina:  Normal appearing with normal color and discharge, no lesions. Atrophy: mild   Cervix:  Normal appearing without discharge or lesions  Uterus:  Normal in size, shape and contour.  Midline and mobile, nontender  Adnexa/parametria:     Rt: Normal in size, without masses or tenderness.   Lt: Normal in size, without masses or tenderness.  Anus and perineum: Normal    Darice Hoit, CMA present for exam  Assessment/Plan:   1. Well woman exam with routine gynecological exam (Primary) Pap 2026 Labs with PCP Schedule mammogram  2. Colon cancer screening - Cologuard  3. Depression screening    Return in 1 year for annual or sooner prn.  Carry Ortez B WHNP-BC, 3:41 PM 01/22/2024

## 2024-02-26 LAB — COLOGUARD

## 2024-03-19 LAB — COLOGUARD: COLOGUARD: POSITIVE — AB

## 2024-03-20 ENCOUNTER — Ambulatory Visit: Payer: Self-pay | Admitting: Radiology

## 2024-03-20 DIAGNOSIS — R195 Other fecal abnormalities: Secondary | ICD-10-CM

## 2024-03-29 NOTE — Progress Notes (Incomplete)
 Assessment/Plan:   Anne Landry is a very pleasant 58 y.o. 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 MoCA today is /30.***.  Patient is able to participate on ADLs***.  Patient continues to drive without significant difficulties.***    Memory Impairment of unclear etiology  MRI brain without contrast to assess for underlying structural abnormality and assess vascular load  Neurocognitive testing to further evaluate cognitive concerns and determine other underlying cause of memory changes, including potential contribution from sleep, anxiety, attention, or depression  Check B1and TSH*** Continue to control mood as per psychiatry Recommend good control of cardiovascular risk factors Folllow up in  months***  Subjective:   The patient is accompanied by ***  who supplement  the history.   How long did patient have memory difficulties? For the last 1 year, feels that it may be getting worsse.  Reports some difficulty remembering new information, conversations and names, difficulty finding the right words.  Long-term memory is good. repeats oneself?  Endorsed Disoriented when walking into a room?  Patient denies except occasionally not remembering what patient came to the room for ***  Leaving objects in unusual places? Denies.   Wandering behavior?  denies .  Any personality changes?  Denies.   Any history of depression?:  Denies   Hallucinations or paranoia?  Denies   Seizures?  Denies    Any sleep changes?   Sleeps better with trazodone. ***denies vivid dreams, REM behavior or sleepwalking   Sleep apnea?  Denies   Any hygiene concerns?  Denies   Independent of bathing and dressing?  Endorsed  Does the patient needs help with medications? Patient is in charge *** Who is in charge of the finances? Patient is in charge   *** Any changes in appetite?  Denies  ***   Patient have trouble swallowing? Denies.   Does the patient cook? No ***  Any kitchen accidents such as leaving the stove on? Denies.   Any history of headaches? History of migraines  *** Chronic pain ? Denies.   Ambulates with difficulty?  Denies.Walks frequently, weight lifts once a week.  *** Recent falls or head injuries? Denies.   Vision changes? Denies.   Any stroke like symptoms? Denies.   Any tremors?   Denies.   Any anosmia?  Denies.   Any incontinence of urine? Denies.   Any bowel dysfunction? Denies.      Patient lives with  *** History of heavy alcohol intake? Denies.   History of heavy tobacco use? Denies.   Family history of dementia? Mother had AD Does patient drive? 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    Pertinent available labs: 09/2023: LDL 108, nl CMP, B12 624,    MRI brain, personally reviewed, remarkable for  ***  Past Medical History:  Diagnosis Date   Hyperlipidemia    Insomnia    klonapin   Migraine without aura    NSTEMI (non-ST elevated myocardial infarction) (HCC)    Spontaneous dissection of coronary artery      No past surgical history on file.   No Known Allergies  Current Outpatient Medications  Medication Instructions   amphetamine-dextroamphetamine (ADDERALL XR) 10 MG 24 hr capsule 10 mg, Every morning   Aspirin  Low Dose 81 mg, Oral, Daily, Swallow whole.  Magnesium 100 mg, Daily   Multiple Vitamins-Minerals (MULTIVITAMIN PO) 1 tablet, Daily   Omega-3 1,000 mg, Daily   Propylene Glycol (SYSTANE COMPLETE OP) 1 drop, Daily PRN   rosuvastatin  (CRESTOR ) 20 mg, Oral, Daily   traZODone (DESYREL) 50-100 mg, At bedtime PRN   VITAMIN D  PO 1 tablet, Daily     VITALS:  There were no vitals filed for this visit.        No data to display              No data to display           Neurological Exam    Orientation:  Alert and  oriented to person, place and time. No aphasia or dysarthria. Fund of knowledge is appropriate. Recent memory impaired and remote memory intact.  Attention and concentration are normal.  Able to name objects and repeat phrases. Delayed recall  /5 Cranial nerves: There is good facial symmetry. Extraocular muscles are intact and visual fields are full to confrontational testing. Speech is fluent and clear. No tongue deviation. Hearing is intact to conversational tone. Tone: Tone is good throughout. Abnormal movements: No tremors. No Asterixis. No Fasciculations Sensation: Sensation is intact to light touch. Vibration is intact at the bilateral big toe.  Coordination: The patient has no difficulty with RAM's or FNF bilaterally. Normal finger to nose  Motor: Strength is 5/5 in the bilateral upper and lower extremities. There is no pronator drift. There are no fasciculations noted. DTR's: Deep tendon reflexes are 2/4 bilaterally. Gait and Station: The patient is able to ambulate without difficulty The patient is able to heel toe walk. Gait is cautious and narrow. The patient is able to ambulate in a tandem fashion.       Thank you for allowing us  the opportunity to participate in the care of this nice patient. Please do not hesitate to contact us  for any questions or concerns.   Total time spent on today's visit was *** minutes dedicated to this patient today, preparing to see patient, examining the patient, ordering tests and/or medications and counseling the patient, documenting clinical information in the EHR or other health record, independently interpreting results and communicating results to the patient/family, discussing treatment and goals, answering patient's questions and coordinating care.  Cc:  Aisha Harvey, MD  Camie Sevin 03/29/2024 4:18 PM

## 2024-03-30 ENCOUNTER — Ambulatory Visit: Admitting: Physician Assistant

## 2024-03-30 ENCOUNTER — Encounter: Payer: Self-pay | Admitting: Physician Assistant

## 2024-03-30 ENCOUNTER — Ambulatory Visit

## 2024-03-30 DIAGNOSIS — Z029 Encounter for administrative examinations, unspecified: Secondary | ICD-10-CM

## 2024-04-01 ENCOUNTER — Encounter: Payer: Self-pay | Admitting: Physician Assistant

## 2024-04-15 ENCOUNTER — Encounter: Payer: Self-pay | Admitting: Gastroenterology

## 2024-05-04 ENCOUNTER — Ambulatory Visit: Admitting: *Deleted

## 2024-05-04 VITALS — Ht 66.75 in | Wt 138.0 lb

## 2024-05-04 DIAGNOSIS — Z1211 Encounter for screening for malignant neoplasm of colon: Secondary | ICD-10-CM

## 2024-05-04 MED ORDER — NA SULFATE-K SULFATE-MG SULF 17.5-3.13-1.6 GM/177ML PO SOLN: 1.0000 | Freq: Once | ORAL | 0 refills | Status: AC

## 2024-05-04 NOTE — Progress Notes (Signed)
 Pt's name and DOB verified at the beginning of the pre-visit with 2 identifiers  Permission given to speak with  Pt denies any difficulty with ambulating,sitting, laying down or rolling side to side  Pt has no issues moving head neck or swallowing  No egg or soy allergy known to patient   No issues known to pt with past sedation  No FH of Malignant Hyperthermia  Pt is not on home 02   Pt is not on blood thinners   Pt denies issues with constipation   Pt is not on dialysis  Pt denise any abnormal heart rhythms   Pt denies any upcoming cardiac testing  Patient's chart reviewed by Norleen Schillings CNRA prior to pre-visit and patient appropriate for the [facility].  Pre-visit completed and red dot placed by patient's name on their procedure day (on provider's schedule).    Visit in person  Pt states weight is 138 lb     Pt given  both [facility] main # and MD on call # prior to instructions.  Informed pt to come in at the time discussed and is shown on PV instructions.  Pt instructed to use Single[employer]com or GoodRx for a price reduction on prep  Instructed pt where to find PV instructions in My Chart and a copy given to pt. Instructed pt on all aspects of written instructions including med holds clothing to wear and foods to eat and not eat as well as after procedure legal restrictions and to call MD on call if needed.. Pt states understanding. Instructed pt to review instructions again prior to procedure and call main # given if has any questions or any issues. Pt states they will.

## 2024-05-11 ENCOUNTER — Encounter: Payer: Self-pay | Admitting: Gastroenterology

## 2024-05-17 ENCOUNTER — Encounter: Payer: Self-pay | Admitting: Gastroenterology

## 2024-05-22 ENCOUNTER — Encounter: Payer: Self-pay | Admitting: Gastroenterology

## 2024-05-22 ENCOUNTER — Ambulatory Visit: Admitting: Gastroenterology

## 2024-05-22 VITALS — BP 121/83 | HR 61 | Temp 97.7°F | Resp 13 | Ht 66.0 in | Wt 138.0 lb

## 2024-05-22 DIAGNOSIS — R195 Other fecal abnormalities: Secondary | ICD-10-CM

## 2024-05-22 DIAGNOSIS — Z1211 Encounter for screening for malignant neoplasm of colon: Secondary | ICD-10-CM

## 2024-05-22 MED ORDER — SODIUM CHLORIDE 0.9 % IV SOLN: 500.0000 mL | INTRAVENOUS | Status: DC

## 2024-05-22 NOTE — Patient Instructions (Signed)
-  Resume previous diet. - Continue present medications. - Repeat colonoscopy in 10 years for screening purposes. - Return to GI office PRN.   YOU HAD AN ENDOSCOPIC PROCEDURE TODAY AT [facility]:   Refer to the procedure report that was given to you for any specific questions about what was found during the examination.  If the procedure report does not answer your questions, please call your gastroenterologist to clarify.  If you requested that your care partner not be given the details of your procedure findings, then the procedure report has been included in a sealed envelope for you to review at your convenience later.  YOU SHOULD EXPECT: Some feelings of bloating in the abdomen. Passage of more gas than usual.  Walking can help get rid of the air that was put into your GI tract during the procedure and reduce the bloating. If you had a lower endoscopy (such as a colonoscopy or flexible sigmoidoscopy) you may notice spotting of blood in your stool or on the toilet paper. If you underwent a bowel prep for your procedure, you may not have a normal bowel movement for a few days.  Please Note:  You might notice some irritation and congestion in your nose or some drainage.  This is from the oxygen used during your procedure.  There is no need for concern and it should clear up in a day or so.  SYMPTOMS TO REPORT IMMEDIATELY:  Following lower endoscopy (colonoscopy or flexible sigmoidoscopy):  Excessive amounts of blood in the stool  Significant tenderness or worsening of abdominal pains  Swelling of the abdomen that is new, acute  Fever of 100F or higher  For urgent or emergent issues, a gastroenterologist can be reached at any hour by calling [phone]. Do not use MyChart messaging for urgent concerns.    DIET:  We do recommend a small meal at first, but then you may proceed to your regular diet.  Drink plenty of fluids but you should avoid alcoholic beverages for 24  hours.  ACTIVITY:  You should plan to take it easy for the rest of today and you should NOT DRIVE or use heavy machinery until tomorrow (because of the sedation medicines used during the test).    FOLLOW UP: Our staff will call the number listed on your records the next business day following your procedure.  We will call around 7:15- 8:00 am to check on you and address any questions or concerns that you may have regarding the information given to you following your procedure. If we do not reach you, we will leave a message.     If any biopsies were taken you will be contacted by phone or by letter within the next 1-3 weeks.  Please call us at [phone] if you have not heard about the biopsies in 3 weeks.    SIGNATURES/CONFIDENTIALITY: You and/or your care partner have signed paperwork which will be entered into your electronic medical record.  These signatures attest to the fact that that the information above on your After Visit Summary has been reviewed and is understood.  Full responsibility of the confidentiality of this discharge information lies with you and/or your care-partner.

## 2024-05-22 NOTE — Op Note (Signed)
 [facility] Patient Name: Anne Landry Procedure Date: 05/22/2024 9:52 AM MRN: [id number] Endoscopist: Sandor Flatter , MD, [id number] Age: 59 Referring MD:  Date of Birth: [birth year] Gender: Female Account #: [id number] Procedure:                Colonoscopy Indications:              Screening for colorectal malignant neoplasm (last                            colonoscopy was 10 years ago)                           Recent +Cologuard. Medicines:                Monitored Anesthesia Care Procedure:                Pre-Anesthesia Assessment:                           - Prior to the procedure, a History and Physical                            was performed, and patient medications and                            allergies were reviewed. The patient's tolerance of                            previous anesthesia was also reviewed. The risks                            and benefits of the procedure and the sedation                            options and risks were discussed with the patient.                            All questions were answered, and informed consent                            was obtained. Prior Anticoagulants: The patient has                            taken no anticoagulant or antiplatelet agents. ASA                            Grade Assessment: II - A patient with mild systemic                            disease. After reviewing the risks and benefits,                            the patient was deemed in satisfactory condition to  undergo the procedure.                           After obtaining informed consent, the colonoscope                            was passed under direct vision. Throughout the                            procedure, the patient's blood pressure, pulse, and                            oxygen saturations were monitored continuously. The                            Olympus Scope SN [id number] was introduced through the                             anus and advanced to the the terminal ileum. The                            colonoscopy was performed without difficulty. The                            patient tolerated the procedure well. The quality                            of the bowel preparation was good. The terminal                            ileum, ileocecal valve, appendiceal orifice, and                            rectum were photographed. Scope In: 10:09:42 AM Scope Out: 10:23:56 AM Scope Withdrawal Time: 0 hours 8 minutes 57 seconds  Total Procedure Duration: 0 hours 14 minutes 14 seconds  Findings:                 The perianal and digital rectal examinations were                            normal.                           The entire colon appeared normal.                           The retroflexed view of the distal rectum and anal                            verge was normal and showed no anal or rectal                            abnormalities.  The terminal ileum appeared normal. Complications:            No immediate complications. Estimated Blood Loss:     Estimated blood loss: none. Impression:               - The entire examined colon is normal.                           - The distal rectum and anal verge are normal on                            retroflexion view.                           - The examined portion of the ileum was normal.                           - No specimens collected. Recommendation:           - Patient has a contact number available for                            emergencies. The signs and symptoms of potential                            delayed complications were discussed with the                            patient. Return to normal activities tomorrow.                            Written discharge instructions were provided to the                            patient.                           - Resume previous diet.                           -  Continue present medications.                           - Repeat colonoscopy in 10 years for screening                            purposes.                           - Return to GI office PRN. Sandor Flatter, MD 05/22/2024 10:29:09 AM

## 2024-05-22 NOTE — Progress Notes (Signed)
 "   GASTROENTEROLOGY PROCEDURE H&P NOTE   Primary Care Physician: Aisha Harvey, MD    Reason for Procedure:  Colon Cancer screening  Plan:    Colonoscopy  Patient is appropriate for endoscopic procedure(s) in the ambulatory ([facility]) setting.  The nature of the procedure, as well as the risks, benefits, and alternatives were carefully and thoroughly reviewed with the patient. Ample time for discussion and questions allowed. The patient understood, was satisfied, and agreed to proceed. I personally addressed all patient questions and concerns.     HPI: Anne Landry is a 59 y.o. female who presents for colonoscopy for routine Colon Cancer screening.  Recent positive Cologuard.  Patient is otherwise without complaints or active issues today.  Family history notable for father with colon polyps and Crohn's.  No known family history of colon cancer.  Past Medical History:  Diagnosis Date   ADD (attention deficit disorder)    CAD (coronary artery disease)    Chronic kidney disease    stones   Hyperlipidemia    Insomnia    klonapin   Migraine without aura    NSTEMI (non-ST elevated myocardial infarction) (HCC)    Spontaneous dissection of coronary artery     Past Surgical History:  Procedure Laterality Date   COLONOSCOPY      Prior to Admission medications  Medication Sig Start Date End Date Taking? Authorizing Provider  Magnesium 100 MG TABS Take 100 mg by mouth daily. 10/12/17  Yes [provider]  Magnesium 100 MG TABS 1 tablet with a meal Orally Once a day   Yes [provider]  Multiple Vitamins-Minerals (MULTIVITAMIN PO) Take 1 tablet by mouth daily.   Yes [provider]  Omega-3 1000 MG CAPS Take 1,000 mg by mouth daily. 10/12/17  Yes [provider]  rosuvastatin  (CRESTOR ) 20 MG tablet TAKE 1 TABLET BY MOUTH EVERY DAY 07/22/23  Yes Jeffrie Oneil BROCKS, MD  traZODone (DESYREL) 50 MG tablet Take 50-100 mg by mouth at bedtime as needed.  01/09/24  Yes [provider]  VITAMIN D  PO Take 1 tablet by mouth daily.   Yes [provider]  amphetamine-dextroamphetamine (ADDERALL XR) 10 MG 24 hr capsule Take 10 mg by mouth every morning. 12/12/23   [provider]  aspirin  EC 81 MG tablet Take 1 tablet (81 mg total) by mouth daily. Swallow whole. 10/06/21   Henry Manuelita NOVAK, NP  Propylene Glycol (SYSTANE COMPLETE OP) Place 1 drop into both eyes daily as needed (dryness).    [provider]    Current Outpatient Medications  Medication Sig Dispense Refill   Magnesium 100 MG TABS Take 100 mg by mouth daily.     Magnesium 100 MG TABS 1 tablet with a meal Orally Once a day     Multiple Vitamins-Minerals (MULTIVITAMIN PO) Take 1 tablet by mouth daily.     Omega-3 1000 MG CAPS Take 1,000 mg by mouth daily.     rosuvastatin  (CRESTOR ) 20 MG tablet TAKE 1 TABLET BY MOUTH EVERY DAY 90 tablet 3   traZODone (DESYREL) 50 MG tablet Take 50-100 mg by mouth at bedtime as needed.     VITAMIN D  PO Take 1 tablet by mouth daily.     amphetamine-dextroamphetamine (ADDERALL XR) 10 MG 24 hr capsule Take 10 mg by mouth every morning.     aspirin  EC 81 MG tablet Take 1 tablet (81 mg total) by mouth daily. Swallow whole. 30 tablet 12   Propylene Glycol (SYSTANE  COMPLETE OP) Place 1 drop into both eyes daily as needed (dryness).     Current Facility-Administered Medications  Medication Dose Route Frequency Provider Last Rate Last Admin   0.9 %  sodium chloride  infusion  500 mL Intravenous Continuous Anjelo Pullman V, DO        Allergies as of 05/22/2024   (No Known Allergies)    Family History  Problem Relation Age of Onset   Hypertension Mother    Colon polyps Father    Crohn's disease Father    Ovarian cancer Maternal Aunt    Colon cancer Neg Hx    Esophageal cancer Neg Hx    Stomach cancer Neg Hx    Rectal cancer Neg Hx     Social History   Socioeconomic History   Marital status: Married    Spouse  name: Not on file   Number of children: Not on file   Years of education: Not on file   Highest education level: Not on file  Occupational History   Not on file  Tobacco Use   Smoking status: Never    Passive exposure: Never   Smokeless tobacco: Never  Vaping Use   Vaping status: Never Used  Substance and Sexual Activity   Alcohol use: Yes    Alcohol/week: 1.0 standard drink of alcohol    Types: 1 Standard drinks or equivalent per week    Comment: rare   Drug use: No   Sexual activity: Yes    Partners: Male    Birth control/protection: Post-menopausal    Comment: Husband has vasectomy, menarche 10yo, sexual debut 59yo  Other Topics Concern   Not on file  Social History Narrative   Not on file   Social Drivers of Health   Tobacco Use: Low Risk (05/22/2024)   Patient History    Smoking Tobacco Use: Never    Smokeless Tobacco Use: Never    Passive Exposure: Never  Financial Resource Strain: Not on file  Food Insecurity: Not on file  Transportation Needs: Not on file  Physical Activity: Not on file  Stress: Not on file  Social Connections: Not on file  Intimate Partner Violence: Not on file  Depression (PHQ2-9): Low Risk (01/22/2024)   Depression (PHQ2-9)    PHQ-2 Score: 0  Alcohol Screen: Not on file  Housing: Not on file  Utilities: Not on file  Health Literacy: Not on file    Physical Exam: Vital signs in last 24 hours: @BP  137/82   Pulse 84   Temp 97.7 F (36.5 C)   Resp 14   Ht 5' 6 (1.676 m)   Wt 138 lb (62.6 kg)   LMP 06/24/2015   SpO2 100%   BMI 22.27 kg/m  GEN: NAD EYE: Sclerae anicteric ENT: MMM CV: Non-tachycardic Pulm: CTA b/l GI: Soft, NT/ND NEURO:  Alert & Oriented x 3   Sandor Flatter, DO [facility]   05/22/2024 10:02 AM  "

## 2024-05-22 NOTE — Progress Notes (Signed)
 Pt's states no medical or surgical changes since previsit or office visit.

## 2024-05-22 NOTE — Progress Notes (Signed)
 Report given to PACU, vss

## 2024-05-25 ENCOUNTER — Telehealth: Payer: Self-pay

## 2024-05-25 NOTE — Telephone Encounter (Signed)
" °  Follow up Call-     05/22/2024    9:37 AM  Call back number  Post procedure Call Back phone  # [phone]  Permission to leave phone message Yes     Patient questions:  Do you have a fever, pain , or abdominal swelling? No. Pain Score  0 *  Have you tolerated food without any problems? Yes.    Have you been able to return to your normal activities? Yes.    Do you have any questions about your discharge instructions: Diet   No. Medications  No. Follow up visit  No.  Do you have questions or concerns about your Care? No.  Actions: * If pain score is 4 or above: No action needed, pain <4.   "

## 2024-05-31 NOTE — Progress Notes (Incomplete)
 "   Assessment/Plan:   Anne Landry is a very pleasant 59 y.o. year old RH female with a history of  hyperlipidemia, DDD seen today for evaluation of memory concerns. MoCA today is /30.***.  Patient is able to participate on ADLs***.  Patient continues to drive without significant difficulties.***    Memory Concerns  MRI brain without contrast to assess for underlying structural abnormality and assess vascular load  Neurocognitive testing to further evaluate cognitive concerns and determine other underlying cause of memory changes, including potential contribution from sleep, anxiety, attention, or depression  Check B12, TSH Continue to control mood as per PCP Recommend good control of cardiovascular risk factors Folllow up in  months***  Subjective:   The patient is accompanied by ***  who supplement  the history.   How long did patient have memory difficulties? For the last ***.  Reports some difficulty remembering new information, conversations and names.  Long-term memory is good. repeats oneself?  Endorsed Disoriented when walking into a room?  Patient denies except occasionally not remembering what patient came to the room for ***  Leaving objects in unusual places? Denies.   Wandering behavior?  denies .  Any personality changes?  Denies.   Any history of depression?:  Denies   Hallucinations or paranoia?  Denies   Seizures?  Denies    Any sleep changes?   Sleeps well with trazodone***does not sleep well***denies vivid dreams, REM behavior or sleepwalking   Sleep apnea?  Denies   Any hygiene concerns?  Denies   Independent of bathing and dressing?  Endorsed  Does the patient needs help with medications? Patient is in charge *** Who is in charge of the finances? Patient is in charge   *** Any changes in appetite?  Denies ***   Patient have trouble swallowing? Denies.   Does the patient cook? No ***  Any kitchen accidents such as leaving the stove on? Denies.   Any  history of headaches?  She has a history of migraines chronic pain ? Denies.   Ambulates with difficulty?  Denies. *** Recent falls or head injuries? Denies.   Vision changes? Denies.   Any stroke like symptoms? Denies.   Any tremors?   Denies.   Any anosmia?  Denies.   Any incontinence of urine? Denies.   Any bowel dysfunction? Denies.      Patient lives with  *** History of heavy alcohol intake? Denies.   History of heavy tobacco use? Denies.   Family history of dementia? Denies.  Does patient drive? Yes ***  Pertinent available labs: ***  MRI brain, personally reviewed, remarkable for  ***  Past Medical History:  Diagnosis Date   ADD (attention deficit disorder)    CAD (coronary artery disease)    Chronic kidney disease    stones   Hyperlipidemia    Insomnia    klonapin   Migraine without aura    NSTEMI (non-ST elevated myocardial infarction) (HCC)    Spontaneous dissection of coronary artery      Past Surgical History:  Procedure Laterality Date   COLONOSCOPY       Allergies[1]  Current Outpatient Medications  Medication Instructions   amphetamine-dextroamphetamine (ADDERALL XR) 10 MG 24 hr capsule 10 mg, Every morning   Aspirin  Low Dose 81 mg, Oral, Daily, Swallow whole.   Magnesium 100 MG TABS 1 tablet with a meal Orally Once a day   Magnesium 100 mg, Daily   Multiple Vitamins-Minerals (MULTIVITAMIN PO) 1 tablet,  Daily   Omega-3 1,000 mg, Daily   Propylene Glycol (SYSTANE COMPLETE OP) 1 drop, Daily PRN   rosuvastatin  (CRESTOR ) 20 mg, Oral, Daily   traZODone (DESYREL) 50-100 mg, At bedtime PRN   VITAMIN D  PO 1 tablet, Daily     VITALS:  There were no vitals filed for this visit.        No data to display              No data to display           Neurological Exam    Orientation:  Alert and oriented to person, place and time. No aphasia or dysarthria. Fund of knowledge is appropriate. Recent memory impaired and remote memory intact.   Attention and concentration are normal.  Able to name objects and repeat phrases. Delayed recall  /5 Cranial nerves: There is good facial symmetry. Extraocular muscles are intact and visual fields are full to confrontational testing. Speech is fluent and clear. No tongue deviation. Hearing is intact to conversational tone. Tone: Tone is good throughout. Abnormal movements: No tremors. No Asterixis. No Fasciculations Sensation: Sensation is intact to light touch. Vibration is intact at the bilateral big toe.  Coordination: The patient has no difficulty with RAM's or FNF bilaterally. Normal finger to nose  Motor: Strength is 5/5 in the bilateral upper and lower extremities. There is no pronator drift. There are no fasciculations noted. DTR's: Deep tendon reflexes are 2/4 bilaterally. Gait and Station: The patient is able to ambulate without difficulty The patient is able to heel toe walk. Gait is cautious and narrow. The patient is able to ambulate in a tandem fashion.       Thank you for allowing us  the opportunity to participate in the care of this nice patient. Please do not hesitate to contact us  for any questions or concerns.   Total time spent on today's visit was *** minutes dedicated to this patient today, preparing to see patient, examining the patient, ordering tests and/or medications and counseling the patient, documenting clinical information in the EHR or other health record, independently interpreting results and communicating results to the patient/family, discussing treatment and goals, answering patient's questions and coordinating care.  Cc:  Aisha Harvey, MD  Camie Sevin 05/31/2024 2:47 PM       [1] No Known Allergies  "

## 2024-06-04 ENCOUNTER — Encounter: Payer: Self-pay | Admitting: Physician Assistant

## 2024-06-04 ENCOUNTER — Other Ambulatory Visit

## 2024-06-04 ENCOUNTER — Ambulatory Visit (INDEPENDENT_AMBULATORY_CARE_PROVIDER_SITE_OTHER): Admitting: Physician Assistant

## 2024-06-04 ENCOUNTER — Ambulatory Visit

## 2024-06-04 VITALS — BP 132/87 | HR 66 | Resp 20 | Ht 66.75 in | Wt 142.0 lb

## 2024-06-04 DIAGNOSIS — R413 Other amnesia: Secondary | ICD-10-CM | POA: Diagnosis not present

## 2024-06-04 NOTE — Patient Instructions (Addendum)
 It was a pleasure to see you today at our office.   Recommendations:  Neurocognitive evaluation at our office  MRI of the brain, the radiology office will call you to arrange you appointment [phone] Check labs today [address] Follow up in  months   [email or web address]   RECOMMENDATIONS FOR ALL PATIENTS WITH MEMORY PROBLEMS: 1. Continue to exercise (Recommend 30 minutes of walking everyday, or 3 hours every week) 2. Increase social interactions - continue going to [place] and enjoy social gatherings with friends and family 3. Eat healthy, avoid fried foods and eat more fruits and vegetables 4. Maintain adequate blood pressure, blood sugar, and blood cholesterol level. Reducing the risk of stroke and cardiovascular disease also helps promoting better memory. 5. Avoid stressful situations. Live a simple life and avoid aggravations. Organize your time and prepare for the next day in anticipation. 6. Sleep well, avoid any interruptions of sleep and avoid any distractions in the bedroom that may interfere with adequate sleep quality 7. Avoid sugar, avoid sweets as there is a strong link between excessive sugar intake, diabetes, and cognitive impairment We discussed the Mediterranean diet, which has been shown to help patients reduce the risk of progressive memory disorders and reduces cardiovascular risk. This includes eating fish, eat fruits and green leafy vegetables, nuts like almonds and hazelnuts, walnuts, and also use olive oil. Avoid fast foods and fried foods as much as possible. Avoid sweets and sugar as sugar use has been linked to worsening of memory function.  There is always a concern of gradual progression of memory problems. If this is the case, then we may need to adjust level of care according to patient needs. Support, both to the patient and caregiver, should then be put into place.      You have been referred for a  neuropsychological evaluation (i.e., evaluation of memory and thinking abilities). Please bring someone with you to this appointment if possible, as it is helpful for the doctor to hear from both you and another adult who knows you well. Please bring eyeglasses and hearing aids if you wear them.    The evaluation will take approximately 3 hours and has two parts:   The first part is a clinical interview with the neuropsychologist (Dr. Richie or Dr. Gayland). During the interview, the neuropsychologist will speak with you and the individual you brought to the appointment.    The second part of the evaluation is testing with the doctor's technician Neal or Luke). During the testing, the technician will ask you to remember different types of material, solve problems, and answer some questionnaires. Your family member will not be present for this portion of the evaluation.   Please note: We must reserve several hours of the neuropsychologist's time and the psychometrician's time for your evaluation appointment. As such, there is a No-Show fee of $100. If you are unable to attend any of your appointments, please contact our office as soon as possible to reschedule.      DRIVING: Regarding driving, in patients with progressive memory problems, driving will be impaired. We advise to have someone else do the driving if trouble finding directions or if minor accidents are reported. Independent driving assessment is available to determine safety of driving.   If you are interested in the driving assessment, you can contact the following:  [facility] in [place] [phone]  [facility] [phone]  [facility] [place] [phone]  [facility] [phone] or [phone]   FALL PRECAUTIONS:  Be cautious when walking. Scan the area for obstacles that may increase the risk of trips and falls. When getting up in the mornings, sit up at the edge of the bed for a  few minutes before getting out of bed. Consider elevating the bed at the head end to avoid drop of blood pressure when getting up. Walk always in a well-lit room (use night lights in the walls). Avoid area rugs or power cords from appliances in the middle of the walkways. Use a walker or a cane if necessary and consider physical therapy for balance exercise. Get your eyesight checked regularly.  FINANCIAL OVERSIGHT: Supervision, especially oversight when making financial decisions or transactions is also recommended.  HOME SAFETY: Consider the safety of the kitchen when operating appliances like stoves, microwave oven, and blender. Consider having supervision and share cooking responsibilities until no longer able to participate in those. Accidents with firearms and other hazards in the house should be identified and addressed as well.   ABILITY TO BE LEFT ALONE: If patient is unable to contact 911 operator, consider using LifeLine, or when the need is there, arrange for someone to stay with patients. Smoking is a fire hazard, consider supervision or cessation. Risk of wandering should be assessed by caregiver and if detected at any point, supervision and safe proof recommendations should be instituted.  MEDICATION SUPERVISION: Inability to self-administer medication needs to be constantly addressed. Implement a mechanism to ensure safe administration of the medications.      Mediterranean Diet A Mediterranean diet refers to food and lifestyle choices that are based on the traditions of countries located on the [employer]. This way of eating has been shown to help prevent certain conditions and improve outcomes for people who have chronic diseases, like kidney disease and heart disease. What are tips for following this plan? Lifestyle  Cook and eat meals together with your family, when possible. Drink enough fluid to keep your urine clear or pale yellow. Be physically active every day.  This includes: Aerobic exercise like running or swimming. Leisure activities like gardening, walking, or housework. Get 7-8 hours of sleep each night. If recommended by your health care provider, drink red wine in moderation. This means 1 glass a day for nonpregnant women and 2 glasses a day for men. A glass of wine equals 5 oz (150 mL). Reading food labels  Check the serving size of packaged foods. For foods such as rice and pasta, the serving size refers to the amount of cooked product, not dry. Check the total fat in packaged foods. Avoid foods that have saturated fat or trans fats. Check the ingredients list for added sugars, such as corn syrup. Shopping  At the grocery store, buy most of your food from the areas near the walls of the store. This includes: Fresh fruits and vegetables (produce). Grains, beans, nuts, and seeds. Some of these may be available in unpackaged forms or large amounts (in bulk). Fresh seafood. Poultry and eggs. Low-fat dairy products. Buy whole ingredients instead of prepackaged foods. Buy fresh fruits and vegetables in-season from local farmers markets. Buy frozen fruits and vegetables in resealable bags. If you do not have access to quality fresh seafood, buy precooked frozen shrimp or canned fish, such as tuna, salmon, or sardines. Buy small amounts of raw or cooked vegetables, salads, or olives from the deli or salad bar at your store. Stock your pantry so you always have certain foods on hand, such as olive oil, canned tuna,  canned tomatoes, rice, pasta, and beans. Cooking  Cook foods with extra-virgin olive oil instead of using butter or other vegetable oils. Have meat as a side dish, and have vegetables or grains as your main dish. This means having meat in small portions or adding small amounts of meat to foods like pasta or stew. Use beans or vegetables instead of meat in common dishes like chili or lasagna. Experiment with different cooking methods.  Try roasting or broiling vegetables instead of steaming or sauteing them. Add frozen vegetables to soups, stews, pasta, or rice. Add nuts or seeds for added healthy fat at each meal. You can add these to yogurt, salads, or vegetable dishes. Marinate fish or vegetables using olive oil, lemon juice, garlic, and fresh herbs. Meal planning  Plan to eat 1 vegetarian meal one day each week. Try to work up to 2 vegetarian meals, if possible. Eat seafood 2 or more times a week. Have healthy snacks readily available, such as: Vegetable sticks with hummus. Greek yogurt. Fruit and nut trail mix. Eat balanced meals throughout the week. This includes: Fruit: 2-3 servings a day Vegetables: 4-5 servings a day Low-fat dairy: 2 servings a day Fish, poultry, or lean meat: 1 serving a day Beans and legumes: 2 or more servings a week Nuts and seeds: 1-2 servings a day Whole grains: 6-8 servings a day Extra-virgin olive oil: 3-4 servings a day Limit red meat and sweets to only a few servings a month What are my food choices? Mediterranean diet Recommended Grains: Whole-grain pasta. Brown rice. Bulgar wheat. Polenta. Couscous. Whole-wheat bread. [place] Made[place]. Vegetables: Artichokes. Beets. Broccoli. Cabbage. Carrots. Eggplant. Green beans. Chard. Kale. Spinach. Onions. Leeks. Peas. Squash. Tomatoes. Peppers. Radishes. Fruits: Apples. Apricots. Avocado. Berries. Bananas. Cherries. Dates. Figs. Grapes. Lemons. Melon. Oranges. Peaches. Plums. Pomegranate. Meats and other protein foods: Beans. Almonds. Sunflower seeds. Pine nuts. Peanuts. Cod. Salmon. Scallops. Shrimp. Tuna. Tilapia. Clams. Oysters. Eggs. Dairy: Low-fat milk. Cheese. Greek yogurt. Beverages: Water. Red wine. Herbal tea. Fats and oils: Extra virgin olive oil. Avocado oil. Grape seed oil. Sweets and desserts: Greek yogurt with honey. Baked apples. Poached pears. Trail mix. Seasoning and other foods: Basil. Cilantro. Coriander. Cumin. Mint.  Parsley. Sage. Rosemary. Tarragon. Garlic. Oregano. Thyme. Pepper. Balsalmic vinegar. Tahini. Hummus. Tomato sauce. Olives. Mushrooms. Limit these Grains: Prepackaged pasta or rice dishes. Prepackaged cereal with added sugar. Vegetables: Deep fried potatoes (french fries). Fruits: Fruit canned in syrup. Meats and other protein foods: Beef. Pork. Lamb. Poultry with skin. Hot dogs. [place]. Dairy: Ice cream. Sour cream. Whole milk. Beverages: Juice. Sugar-sweetened soft drinks. Beer. Liquor and spirits. Fats and oils: Butter. Canola oil. Vegetable oil. Beef fat (tallow). Lard. Sweets and desserts: Cookies. Cakes. Pies. Candy. Seasoning and other foods: Mayonnaise. Premade sauces and marinades. The items listed may not be a complete list. Talk with your dietitian about what dietary choices are right for you. Summary The Mediterranean diet includes both food and lifestyle choices. Eat a variety of fresh fruits and vegetables, beans, nuts, seeds, and whole grains. Limit the amount of red meat and sweets that you eat. Talk with your health care provider about whether it is safe for you to drink red wine in moderation. This means 1 glass a day for nonpregnant women and 2 glasses a day for men. A glass of wine equals 5 oz (150 mL). This information is not intended to replace advice given to you by your health care provider. Make sure you discuss any questions you have with  your health care provider. Document Released: 12/22/2015 Document Revised: 01/24/2016 Document Reviewed: 12/22/2015 Elsevier Interactive Patient Education  2017 [employer].

## 2024-06-05 ENCOUNTER — Ambulatory Visit: Payer: Self-pay | Admitting: Physician Assistant

## 2024-06-15 ENCOUNTER — Institutional Professional Consult (permissible substitution): Admitting: Psychology

## 2024-06-15 ENCOUNTER — Ambulatory Visit

## 2024-06-17 ENCOUNTER — Encounter: Payer: Self-pay | Admitting: Physician Assistant

## 2024-06-18 LAB — TSH: TSH: 1.43 m[IU]/L (ref 0.40–4.50)

## 2024-06-18 LAB — AD-DETECT ABETA 42/40 AND P-TAU217 EVALUATION, PLASMA
ABETA 40: 244 pg/mL
ABETA 42/40 RATIO: 0.18
ABETA 42: 44 pg/mL
AD DETECT PTAU217: 0.72 pg/mL
INTERPRETATION: UNDETERMINED
LIKELIHOOD SCORE: 0.4876 — ABNORMAL HIGH

## 2024-06-18 LAB — TIQ-MISC

## 2024-06-18 LAB — ADMARK® APOE GENOTYPE ANALYSIS AND INTERPRETATION SYMPTOMATIC

## 2024-06-18 LAB — VITAMIN B12: Vitamin B-12: 663 pg/mL (ref 200–1100)

## 2024-06-21 ENCOUNTER — Other Ambulatory Visit

## 2024-06-26 ENCOUNTER — Ambulatory Visit: Payer: Self-pay

## 2024-06-26 ENCOUNTER — Institutional Professional Consult (permissible substitution): Payer: Self-pay | Admitting: Psychology

## 2024-06-29 ENCOUNTER — Institutional Professional Consult (permissible substitution): Admitting: Psychology

## 2024-06-29 ENCOUNTER — Ambulatory Visit

## 2024-07-10 ENCOUNTER — Encounter: Payer: Self-pay | Admitting: Psychology

## 2024-07-14 ENCOUNTER — Ambulatory Visit: Payer: Self-pay | Admitting: Physician Assistant

## 2024-07-22 ENCOUNTER — Ambulatory Visit: Admitting: Dermatology

## 2024-07-23 ENCOUNTER — Encounter (HOSPITAL_BASED_OUTPATIENT_CLINIC_OR_DEPARTMENT_OTHER): Payer: Self-pay | Admitting: Obstetrics and Gynecology

## 2025-01-22 ENCOUNTER — Ambulatory Visit: Admitting: Radiology
